# Patient Record
Sex: Female | Born: 1970 | Race: White | Hispanic: No | Marital: Married | Smoking: Former smoker
Health system: Southern US, Community
[De-identification: ages and names within clinical notes are randomized; demographics above are authoritative.]

## PROBLEM LIST (undated history)

## (undated) DIAGNOSIS — K219 Gastro-esophageal reflux disease without esophagitis: Secondary | ICD-10-CM

---

## 2000-11-06 ENCOUNTER — Other Ambulatory Visit: Admission: RE | Admit: 2000-11-06 | Discharge: 2000-11-06 | Payer: Self-pay | Admitting: Obstetrics and Gynecology

## 2001-03-30 ENCOUNTER — Inpatient Hospital Stay (HOSPITAL_COMMUNITY): Admission: AD | Admit: 2001-03-30 | Discharge: 2001-03-30 | Payer: Self-pay | Admitting: Obstetrics and Gynecology

## 2001-03-31 ENCOUNTER — Inpatient Hospital Stay (HOSPITAL_COMMUNITY): Admission: AD | Admit: 2001-03-31 | Discharge: 2001-03-31 | Payer: Self-pay | Admitting: Obstetrics and Gynecology

## 2001-05-18 ENCOUNTER — Inpatient Hospital Stay (HOSPITAL_COMMUNITY): Admission: AD | Admit: 2001-05-18 | Discharge: 2001-05-18 | Payer: Self-pay | Admitting: *Deleted

## 2001-06-07 ENCOUNTER — Inpatient Hospital Stay (HOSPITAL_COMMUNITY): Admission: AD | Admit: 2001-06-07 | Discharge: 2001-06-09 | Payer: Self-pay | Admitting: Obstetrics and Gynecology

## 2001-07-14 ENCOUNTER — Other Ambulatory Visit: Admission: RE | Admit: 2001-07-14 | Discharge: 2001-07-14 | Payer: Self-pay | Admitting: Obstetrics and Gynecology

## 2002-08-31 ENCOUNTER — Other Ambulatory Visit: Admission: RE | Admit: 2002-08-31 | Discharge: 2002-08-31 | Payer: Self-pay | Admitting: Obstetrics and Gynecology

## 2002-11-30 ENCOUNTER — Encounter: Admission: RE | Admit: 2002-11-30 | Discharge: 2002-11-30 | Payer: Self-pay

## 2003-05-09 ENCOUNTER — Other Ambulatory Visit: Admission: RE | Admit: 2003-05-09 | Discharge: 2003-05-09 | Payer: Self-pay | Admitting: Obstetrics and Gynecology

## 2003-05-29 ENCOUNTER — Encounter: Admission: RE | Admit: 2003-05-29 | Discharge: 2003-08-27 | Payer: Self-pay | Admitting: Obstetrics and Gynecology

## 2003-12-03 ENCOUNTER — Inpatient Hospital Stay (HOSPITAL_COMMUNITY): Admission: AD | Admit: 2003-12-03 | Discharge: 2003-12-05 | Payer: Self-pay | Admitting: Obstetrics and Gynecology

## 2003-12-19 ENCOUNTER — Emergency Department (HOSPITAL_COMMUNITY): Admission: EM | Admit: 2003-12-19 | Discharge: 2003-12-19 | Payer: Self-pay | Admitting: Emergency Medicine

## 2003-12-26 ENCOUNTER — Other Ambulatory Visit: Admission: RE | Admit: 2003-12-26 | Discharge: 2003-12-26 | Payer: Self-pay | Admitting: Obstetrics and Gynecology

## 2004-05-23 ENCOUNTER — Encounter: Admission: RE | Admit: 2004-05-23 | Discharge: 2004-05-23 | Payer: Self-pay | Admitting: Obstetrics and Gynecology

## 2010-02-16 ENCOUNTER — Emergency Department (HOSPITAL_COMMUNITY): Admission: EM | Admit: 2010-02-16 | Discharge: 2010-02-16 | Payer: Self-pay | Admitting: Family Medicine

## 2010-12-24 ENCOUNTER — Other Ambulatory Visit: Payer: Self-pay | Admitting: Internal Medicine

## 2010-12-24 DIAGNOSIS — G44009 Cluster headache syndrome, unspecified, not intractable: Secondary | ICD-10-CM

## 2010-12-24 DIAGNOSIS — R29810 Facial weakness: Secondary | ICD-10-CM

## 2010-12-27 ENCOUNTER — Other Ambulatory Visit: Payer: Self-pay

## 2010-12-31 ENCOUNTER — Other Ambulatory Visit: Payer: Self-pay

## 2010-12-31 LAB — POCT RAPID STREP A (OFFICE): Streptococcus, Group A Screen (Direct): POSITIVE — AB

## 2011-02-28 NOTE — H&P (Signed)
[facility]  Patient:    Sandy Gilbert, Sandy Gilbert Visit Number: [id number] MRN: [id number]          Service Type: Attending:  Sheronette A. Cherly Hensen, M.D. Dictated by:   Sheria Lang. Cherly Hensen, M.D. Adm. Date:  06/06/01                           History and Physical  DATE OF BIRTH:                1971-03-17  CHIEF COMPLAINT:              Post dates induction of labor.  HISTORY OF PRESENT ILLNESS:   This is a 40 year old gravida 1 para 0 female; LMP of August 23, 2000; [facility] of May 31, 2001; now at 40 and six-sevenths weeks gestation, being admitted for induction of labor.  The patient has had some mild occasional contractions, good fetal movement, intact membranes.  Gbs culture is negative.  Her last ultrasound on June 02, 2001 showed an estimated fetal weight of 8 pounds 9 ounces, normal amniotic fluid index, vertex presentation.  Prenatal care is at [facility], primary obstetrician Sheronette A. Cherly Hensen, M.D.  PRENATAL LABORATORY:          Blood type A positive, antibody screen negative. Rubella is equivocal.  RPR is nonreactive.  HIV test was nonreactive. Hepatitis B surface antigen is negative.  GC and chlamydia cultures are negative.  Pap was normal.  AFP3 test was normal.  Anatomic fetal survey was completed and normal on February 03, 2001.  One-hour GCT was normal.  Group B strep culture is negative.  PAST HISTORY:  ALLERGIES:                    No known drug allergies.  MEDICATIONS:                  Prenatal vitamins.  MEDICAL HISTORY:              Negative.  SURGICAL HISTORY:             Adenoids at age 28.  LEEP procedure in August 1994.  FAMILY HISTORY:               Sister alive with cervical cancer, had a hysterectomy.  Cancer of the colon - paternal aunt x 2.  Father has diabetes, adult onset and MIs in some paternal uncles.  SOCIAL HISTORY:               Married, nonsmoker.  Works in the Sports administrator.  REVIEW  OF SYSTEMS:            Negative except as noted in the history of present illness.  PHYSICAL EXAMINATION:  GENERAL:                      Gravid white female in no acute distress.  VITAL SIGNS:                  Blood pressure 100/70.  SKIN:                         Shows no lesions.  HEENT:                        Anicteric sclerae, pink conjunctivae. Oropharynx negative.  HEART:  Regular rate and rhythm without murmur.  LUNGS:                        Clear to auscultation.  BREASTS:                      Soft, nontender.  No palpable mass.  ABDOMEN:                      Gravid, term.  PELVIC:                       She was 2, 70%, -1, vertex presentation in the office on June 02, 2001.  IMPRESSION:                   Post dates, group B strep culture negative.  PLAN:                         Admission.  Pitocin induction if confirm favorable cervix, with amniotomy when feasible.  Routine obstetrical labs. Dictated by:   Sheria Lang. Cherly Hensen, M.D. Attending:  Sheronette A. Cherly Hensen, M.D. DD:  06/06/01 TD:  06/06/01 Job: [id number]

## 2011-02-28 NOTE — H&P (Signed)
NAME:  Sandy Gilbert, EBLEN                        ACCOUNT NO.:  [id number]   MEDICAL RECORD NO.:  [id number]                   PATIENT TYPE:  INP   LOCATION:  9164                                 FACILITY:  [facility]   PHYSICIAN:  Maxie Better, M.D.            DATE OF BIRTH:  1971-04-10   DATE OF ADMISSION:  12/03/2003  DATE OF DISCHARGE:                                HISTORY & PHYSICAL   CHIEF COMPLAINT:  Induction of labor secondary to favorable cervix.   HISTORY OF PRESENT ILLNESS:  A 40 year old, G2, P1-0-0-1, married, white  female with EDC of December 04, 2003, consistent with ultrasound done at 20-  4/7 weeks on July 19, 2003, who is now at term admitted for induction of  labor secondary to favorable cervix.  Exam on November 28, 2003, showed a  loose, 3 cm dilatation, 50% effaced, -2 to -1 vertex presentation.  She has  had increased pelvic pressure, irregular low back pain with good fetal  movements.  Group B Streptococcus culture was negative.  Her prenatal course  has been unremarkable.  LEEP in 1994, with term delivery in past.  The  patient desires to proceed with induction of labor.   PRENATAL COURSE:  Prenatal care is at [facility].  Primary obstetrician  is Maxie Better, M.D.   PRENATAL LABORATORY DATA:  Blood type is A positive, antibody screen  negative, RPR nonreactive.  Rubella immune.  Hepatitis B surface antigen is  negative.  HIV test is negative.  GC and Chlamydia cultures negative.  Pap  smear within normal limits.  AFP normal.  One-hour glucose challenge test  normal.  Group B Streptococcus culture negative.  Normal anatomic fetal  survey at 20.5 weeks on July 19, 2003.   ALLERGIES:  No known drug allergies.   MEDICATIONS:  Prenatal vitamins.   PAST MEDICAL HISTORY:  Gestational anemia.   PAST OBSTETRICAL HISTORY:  An 8 pound 4 ounce vacuum extraction at 41 weeks  with cord around the neck noted at the time.   PAST SURGICAL HISTORY:  1. LEEP in August 1994.  2. Adenoids at age 67.   FAMILY HISTORY:  Diabetes in father and paternal uncles.  Father with  history of blood clots.  Sister with cervical cancer diagnosed in her early  88s.  Colon cancer in two paternal aunts.  No breast cancer or ovarian  cancer.   SOCIAL HISTORY:  Married with one child.  Nonsmoker.  Futures trader.   REVIEW OF SYMPTOMS:  Negative.   PHYSICAL EXAMINATION:  GENERAL:  Well-developed, well-nourished, gravid,  white female in no acute distress.  VITAL SIGNS:  Blood pressure 113/63, afebrile.  SKIN:  No lesions.  HEENT:  Anicteric sclerae, pink conjunctivae.  Oropharynx negative.  HEART:  Regular rate and rhythm without murmur.  BREASTS:  Soft, nontender, no palpable mass.  ABDOMEN:  Gravid at term.  PELVIC:  Per HPI.  Baseline fetal heart rate  of 130s, reactive, rare  contractions.  EXTREMITIES:  Trace edema.   IMPRESSION:  Favorable cervix, term gestation with Group B Streptococcus  culture negative.   PLAN:  1. Admission.  2. High-dose Pitocin.  3. Epidural p.r.n.  4. Routine admission orders and labs.                                               Maxie Better, M.D.    [facility]  D:  12/03/2003  T:  12/03/2003  Job:  [id number]

## 2011-11-07 ENCOUNTER — Ambulatory Visit (INDEPENDENT_AMBULATORY_CARE_PROVIDER_SITE_OTHER): Payer: BC Managed Care – PPO

## 2011-11-07 DIAGNOSIS — J019 Acute sinusitis, unspecified: Secondary | ICD-10-CM

## 2012-03-04 ENCOUNTER — Ambulatory Visit (INDEPENDENT_AMBULATORY_CARE_PROVIDER_SITE_OTHER): Payer: BC Managed Care – PPO | Admitting: Physician Assistant

## 2012-03-04 VITALS — BP 102/68 | HR 75 | Temp 99.0°F | Resp 18 | Ht 66.0 in | Wt 152.6 lb

## 2012-03-04 DIAGNOSIS — R059 Cough, unspecified: Secondary | ICD-10-CM

## 2012-03-04 DIAGNOSIS — R05 Cough: Secondary | ICD-10-CM

## 2012-03-04 DIAGNOSIS — R509 Fever, unspecified: Secondary | ICD-10-CM

## 2012-03-04 DIAGNOSIS — J029 Acute pharyngitis, unspecified: Secondary | ICD-10-CM

## 2012-03-04 LAB — POCT RAPID STREP A (OFFICE): Rapid Strep A Screen: NEGATIVE

## 2012-03-04 MED ORDER — AMOXICILLIN 875 MG PO TABS: 875.0000 mg | ORAL_TABLET | Freq: Two times a day (BID) | ORAL | Status: AC

## 2012-03-04 NOTE — Progress Notes (Signed)
  Subjective:    Patient ID: Sandy Gilbert, female    DOB: August 25, 1971, 41 y.o.   MRN: [id number]  HPI Patient presents for evaluation of a sore throat. States her 82 y.o daughter was diagnosed with strep throat yesterday and today she has low-grade fever, chills, and sore throat. Admits to cough, congestion, and rhinorrhea due to lingering URI, but states the sore throat is new. She has not taken any medications yet. Has history of strep infections.     Review of Systems  Constitutional: Positive for fever and chills.  HENT: Positive for congestion, sore throat and rhinorrhea.   Respiratory: Positive for cough.   Skin: Negative for rash.  Neurological: Negative for headaches.       Objective:   Physical Exam  Constitutional: She is oriented to person, place, and time. She appears well-developed and well-nourished.  HENT:  Head: Normocephalic and atraumatic.  Right Ear: External ear normal.  Left Ear: External ear normal.  Mouth/Throat: Uvula is midline and mucous membranes are normal. No oropharyngeal exudate or tonsillar abscesses.       Bilateral tonsillar erythema. No tonsillar swelling  Eyes: Conjunctivae are normal.  Neck: Neck supple.  Cardiovascular: Normal rate, regular rhythm and normal heart sounds.   Pulmonary/Chest: Effort normal and breath sounds normal.  Lymphadenopathy:    She has no cervical adenopathy.  Neurological: She is alert and oriented to person, place, and time.  Psychiatric: She has a normal mood and affect. Her behavior is normal. Judgment and thought content normal.   Results for orders placed in visit on 03/04/12  POCT RAPID STREP A (OFFICE)      Component Value Range   Rapid Strep A Screen Negative  Negative           Assessment & Plan:   1. Acute pharyngitis  Will treat prophylactically for strep due to recent +contact.  Strep culture done POCT rapid strep A, Culture, Group A Strep, amoxicillin (AMOXIL) 875 MG tablet bid x 10 days     2. Cough    3. Fever

## 2012-03-04 NOTE — Patient Instructions (Signed)

## 2012-03-07 LAB — CULTURE, GROUP A STREP: Organism ID, Bacteria: NORMAL

## 2012-03-08 NOTE — Progress Notes (Signed)
Precepted with Ms. Marte, PA-C and agree.  

## 2012-03-09 ENCOUNTER — Telehealth: Payer: Self-pay

## 2012-03-09 NOTE — Telephone Encounter (Signed)
PT STATES SHE WAS SEEN AND GIVEN A STREP TEST, SHE DIDN'T HAVE STREP BUT ISN'T ANY BETTER PLEASE CALL [phone]

## 2012-03-09 NOTE — Telephone Encounter (Signed)
Spoke w/pt about her lab results and pt agreed to RTC for recheck as directed by [facility] on lab result notes.

## 2012-11-11 ENCOUNTER — Other Ambulatory Visit: Payer: Self-pay | Admitting: Obstetrics and Gynecology

## 2012-11-11 DIAGNOSIS — Z1231 Encounter for screening mammogram for malignant neoplasm of breast: Secondary | ICD-10-CM

## 2012-11-23 ENCOUNTER — Ambulatory Visit
Admission: RE | Admit: 2012-11-23 | Discharge: 2012-11-23 | Disposition: A | Discharge status: Home / Self Care | Payer: BC Managed Care – PPO | Source: Ambulatory Visit | Attending: Obstetrics and Gynecology | Admitting: Obstetrics and Gynecology

## 2012-11-23 DIAGNOSIS — Z1231 Encounter for screening mammogram for malignant neoplasm of breast: Secondary | ICD-10-CM

## 2013-11-30 ENCOUNTER — Ambulatory Visit (INDEPENDENT_AMBULATORY_CARE_PROVIDER_SITE_OTHER): Payer: BC Managed Care – PPO | Admitting: Family Medicine

## 2013-11-30 VITALS — BP 116/62 | HR 86 | Temp 98.9°F | Resp 16 | Ht 66.5 in | Wt 163.0 lb

## 2013-11-30 DIAGNOSIS — K219 Gastro-esophageal reflux disease without esophagitis: Secondary | ICD-10-CM

## 2013-11-30 DIAGNOSIS — R1110 Vomiting, unspecified: Secondary | ICD-10-CM

## 2013-11-30 DIAGNOSIS — R12 Heartburn: Secondary | ICD-10-CM

## 2013-11-30 LAB — POCT CBC
Granulocyte percent: 72.3 %G (ref 37–80)
HCT, POC: 42 % (ref 37.7–47.9)
HEMOGLOBIN: 13 g/dL (ref 12.2–16.2)
Lymph, poc: 2 (ref 0.6–3.4)
MCH, POC: 28.3 pg (ref 27–31.2)
MCHC: 31 g/dL — AB (ref 31.8–35.4)
MCV: 91.2 fL (ref 80–97)
MID (cbc): 0.4 (ref 0–0.9)
MPV: 9.4 fL (ref 0–99.8)
POC GRANULOCYTE: 6.4 (ref 2–6.9)
POC LYMPH PERCENT: 22.8 %L (ref 10–50)
POC MID %: 4.9 % (ref 0–12)
Platelet Count, POC: 290 10*3/uL (ref 142–424)
RBC: 4.6 M/uL (ref 4.04–5.48)
RDW, POC: 14 %
WBC: 8.9 10*3/uL (ref 4.6–10.2)

## 2013-11-30 MED ORDER — ESOMEPRAZOLE MAGNESIUM 40 MG PO CPDR: 20.0000 mg | DELAYED_RELEASE_CAPSULE | Freq: Every day | ORAL | Status: DC

## 2013-11-30 NOTE — Patient Instructions (Signed)
Drink plenty of fluids and eat a bland diet.  Take miralax daily until stools are on the loose side  Continue zantac (ranitidine) 150 mg twice daily for 2 days)  Take Nexium one daily for 10 days  If worse or not improving return and we will pursue other evaluation as needed.

## 2013-11-30 NOTE — Progress Notes (Signed)
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  In the night she got heartburn.  She vomited and dry heaved into Monday AM, none since.  She has "constipated-like" bowel movements.  No diarrhea.  Continues with episodic heartburn, despite antacids and zantac.  Objective": No acute distress.  Chest clear.  Heart normal.  BS active.  Abdomen soft with some high epigastric tenderness.  Assessment: GERD Constipation  Plan: CBC  Results for orders placed in visit on 11/30/13  POCT CBC      Result Value Ref Range   WBC 8.9  4.6 - 10.2 K/uL   Lymph, poc 2.0  0.6 - 3.4   POC LYMPH PERCENT 22.8  10 - 50 %L   MID (cbc) 0.4  0 - 0.9   POC MID % 4.9  0 - 12 %M   POC Granulocyte 6.4  2 - 6.9   Granulocyte percent 72.3  37 - 80 %G   RBC 4.60  4.04 - 5.48 M/uL   Hemoglobin 13.0  12.2 - 16.2 g/dL   HCT, POC 16.142.0  09.637.7 - 47.9 %   MCV 91.2  80 - 97 fL   MCH, POC 28.3  27 - 31.2 pg   MCHC 31.0 (*) 31.8 - 35.4 g/dL   RDW, POC 04.514.0     Platelet Count, POC 290  142 - 424 K/uL   MPV 9.4  0 - 99.8 fL

## 2013-12-19 ENCOUNTER — Ambulatory Visit (INDEPENDENT_AMBULATORY_CARE_PROVIDER_SITE_OTHER): Payer: BC Managed Care – PPO | Admitting: Emergency Medicine

## 2013-12-19 VITALS — BP 110/66 | HR 89 | Temp 98.7°F | Resp 18 | Ht 65.5 in | Wt 158.0 lb

## 2013-12-19 DIAGNOSIS — J029 Acute pharyngitis, unspecified: Secondary | ICD-10-CM

## 2013-12-19 MED ORDER — SUCRALFATE 1 G PO TABS: ORAL_TABLET | ORAL | Status: DC

## 2013-12-19 NOTE — Patient Instructions (Signed)
Pharyngitis °Pharyngitis is redness, pain, and swelling (inflammation) of your pharynx.  °CAUSES  °Pharyngitis is usually caused by infection. Most of the time, these infections are from viruses (viral) and are part of a cold. However, sometimes pharyngitis is caused by bacteria (bacterial). Pharyngitis can also be caused by allergies. Viral pharyngitis may be spread from person to person by coughing, sneezing, and personal items or utensils (cups, forks, spoons, toothbrushes). Bacterial pharyngitis may be spread from person to person by more intimate contact, such as kissing.  °SIGNS AND SYMPTOMS  °Symptoms of pharyngitis include:   °· Sore throat.   °· Tiredness (fatigue).   °· Low-grade fever.   °· Headache. °· Joint pain and muscle aches. °· Skin rashes. °· Swollen lymph nodes. °· Plaque-like film on throat or tonsils (often seen with bacterial pharyngitis). °DIAGNOSIS  °Your health care provider will ask you questions about your illness and your symptoms. Your medical history, along with a physical exam, is often all that is needed to diagnose pharyngitis. Sometimes, a rapid strep test is done. Other lab tests may also be done, depending on the suspected cause.  °TREATMENT  °Viral pharyngitis will usually get better in 3 4 days without the use of medicine. Bacterial pharyngitis is treated with medicines that kill germs (antibiotics).  °HOME CARE INSTRUCTIONS  °· Drink enough water and fluids to keep your urine clear or pale yellow.   °· Only take over-the-counter or prescription medicines as directed by your health care provider:   °· If you are prescribed antibiotics, make sure you finish them even if you start to feel better.   °· Do not take aspirin.   °· Get lots of rest.   °· Gargle with 8 oz of salt water (½ tsp of salt per 1 qt of water) as often as every 1 2 hours to soothe your throat.   °· Throat lozenges (if you are not at risk for choking) or sprays may be used to soothe your throat. °SEEK MEDICAL  CARE IF:  °· You have large, tender lumps in your neck. °· You have a rash. °· You cough up green, yellow-brown, or bloody spit. °SEEK IMMEDIATE MEDICAL CARE IF:  °· Your neck becomes stiff. °· You drool or are unable to swallow liquids. °· You vomit or are unable to keep medicines or liquids down. °· You have severe pain that does not go away with the use of recommended medicines. °· You have trouble breathing (not caused by a stuffy nose). °MAKE SURE YOU:  °· Understand these instructions. °· Will watch your condition. °· Will get help right away if you are not doing well or get worse. °Document Released: 09/29/2005 Document Revised: 07/20/2013 Document Reviewed: 06/06/2013 °ExitCare® Patient Information ©2014 [employer]. ° °

## 2013-12-19 NOTE — Progress Notes (Signed)
[facility] [address], [place] [address] [phone]  Date:  12/19/2013   Name:  Sandy Gilbert   DOB:  [birth year]   MRN:  [id number]  PCP:  No PCP Per Patient    Chief Complaint: URI   History of Present Illness:  Sandy Gilbert is a 43 y.o. very pleasant female patient who presents with the following:  Ill with sore throat and cough.  Has increased size and painful lymph nodes over past 24 hours.  Previously had nasal congestion clear in color with a cough productive of mucoid sputum.  No wheezing or shortness of breath. Felt feverish but no chills. No rash or nausea or vomiting.  No improvement with over the counter medications or other home remedies. No improvement with over the counter medications or other home remedies. Denies other complaint or health concern today. Multiple kids at home sick.    There are no active problems to display for this patient.   No past medical history on file.  No past surgical history on file.  History  Substance Use Topics  . Smoking status: Former Smoker    Quit date: 03/04/2000  . Smokeless tobacco: Never Used  . Alcohol Use: Yes     Comment: very rare    No family history on file.  No Known Allergies  Medication list has been reviewed and updated.  Current Outpatient Prescriptions on File Prior to Visit  Medication Sig Dispense Refill  . esomeprazole (NEXIUM) 40 MG capsule Take 1 capsule (40 mg total) by mouth daily.  30 capsule  1  . etonogestrel-ethinyl estradiol (NUVARING) 0.12-0.015 MG/24HR vaginal ring Place 1 each vaginally every 28 (twenty-eight) days. Insert vaginally and leave in place for 3 consecutive weeks, then remove for 1 week.       No current facility-administered medications on file prior to visit.    Review of Systems:  As per HPI, otherwise negative.    Physical Examination: Filed Vitals:   12/19/13 1914  BP: 110/66  Pulse: 89  Temp: 98.7 F (37.1 C)  Resp: 18    Filed Vitals:   12/19/13 1914  Height: 5' 5.5" (1.664 m)  Weight: 158 lb (71.668 kg)   Body mass index is 25.88 kg/(m^2). Ideal Body Weight: Weight in (lb) to have BMI = 25: 152.2  GEN: WDWN, NAD, Non-toxic, A & O x 3 HEENT: Atraumatic, Normocephalic. Neck supple. No masses, No LAD. Ears and Nose: No external deformity. CV: RRR, No M/G/R. No JVD. No thrill. No extra heart sounds. PULM: CTA B, no wheezes, crackles, rhonchi. No retractions. No resp. distress. No accessory muscle use. ABD: S, NT, ND, +BS. No rebound. No HSM. EXTR: No c/c/e NEURO Normal gait.  PSYCH: Normally interactive. Conversant. Not depressed or anxious appearing.  Calm demeanor.    Assessment and Plan: Pharyngitis likely viral Children with viral pharyngitis   Signed,  Phillips OdorJeffery Jaquan Sadowsky, MD

## 2013-12-24 ENCOUNTER — Ambulatory Visit: Payer: BC Managed Care – PPO

## 2013-12-24 ENCOUNTER — Ambulatory Visit (INDEPENDENT_AMBULATORY_CARE_PROVIDER_SITE_OTHER): Payer: BC Managed Care – PPO | Admitting: Family Medicine

## 2013-12-24 VITALS — BP 104/60 | HR 119 | Temp 100.4°F | Resp 18 | Wt 156.0 lb

## 2013-12-24 DIAGNOSIS — R05 Cough: Secondary | ICD-10-CM

## 2013-12-24 DIAGNOSIS — J029 Acute pharyngitis, unspecified: Secondary | ICD-10-CM

## 2013-12-24 DIAGNOSIS — J189 Pneumonia, unspecified organism: Secondary | ICD-10-CM

## 2013-12-24 DIAGNOSIS — J069 Acute upper respiratory infection, unspecified: Secondary | ICD-10-CM

## 2013-12-24 DIAGNOSIS — R059 Cough, unspecified: Secondary | ICD-10-CM

## 2013-12-24 DIAGNOSIS — R509 Fever, unspecified: Secondary | ICD-10-CM

## 2013-12-24 LAB — POCT CBC
Granulocyte percent: 78.2 %G (ref 37–80)
HCT, POC: 37.1 % — AB (ref 37.7–47.9)
Hemoglobin: 11.7 g/dL — AB (ref 12.2–16.2)
Lymph, poc: 2.5 (ref 0.6–3.4)
MCH: 27.7 pg (ref 27–31.2)
MCHC: 31.5 g/dL — AB (ref 31.8–35.4)
MCV: 87.6 fL (ref 80–97)
MID (CBC): 0.8 (ref 0–0.9)
MPV: 9.3 fL (ref 0–99.8)
PLATELET COUNT, POC: 359 10*3/uL (ref 142–424)
POC Granulocyte: 11.8 — AB (ref 2–6.9)
POC LYMPH %: 16.6 % (ref 10–50)
POC MID %: 5.2 %M (ref 0–12)
RBC: 4.23 M/uL (ref 4.04–5.48)
RDW, POC: 13.4 %
WBC: 15.1 10*3/uL — AB (ref 4.6–10.2)

## 2013-12-24 LAB — POCT RAPID STREP A (OFFICE): RAPID STREP A SCREEN: NEGATIVE

## 2013-12-24 LAB — POCT INFLUENZA A/B
Influenza A, POC: NEGATIVE
Influenza B, POC: NEGATIVE

## 2013-12-24 MED ORDER — AMOXICILLIN-POT CLAVULANATE 875-125 MG PO TABS: 1.0000 | ORAL_TABLET | Freq: Two times a day (BID) | ORAL | Status: DC

## 2013-12-24 MED ORDER — IPRATROPIUM BROMIDE 0.03 % NA SOLN: 2.0000 | Freq: Two times a day (BID) | NASAL | Status: DC

## 2013-12-24 NOTE — Progress Notes (Addendum)
Subjective:    Patient ID: Sandy Gilbert, female    DOB: [birth year], 43 y.o.   MRN: [id number]  Fever  Associated symptoms include abdominal pain, chest pain, congestion, coughing, diarrhea, headaches, nausea and vomiting. Pertinent negatives include no ear pain, rash, sore throat or wheezing.  Cough Associated symptoms include chest pain, chills, a fever, headaches, postnasal drip, rhinorrhea and shortness of breath. Pertinent negatives include no ear pain, rash, sore throat or wheezing.   Chief Complaint  Patient presents with  . Fever    102.8 today -was seen on 03/08 has gotten worse   . Generalized Body Aches  . Cough  . Nasal Congestion    This chart was scribed for Ethelda Chick, MD by Andrew Au, ED Scribe. This patient was seen in room 14 and the patient's care was started at 8:57 AM.  HPI Comments: Sandy Gilbert is a 43 y.o. female who [facility] complaining of worsening cold symptoms onset 2 weeks. Pt states that she was seen 5 days ago by Dr. Dareen Piano and that her symptoms have worsened.  Pt reports that her overall symptoms started 2 weeks ago with cough and congestion. She reports that she was taking OTC medication and was feeling better. She reports that her symptoms came back a week later. She reports that her daughter was seen for a virus 6 days ago and that she came in the day after. Pt has had 4 episodes of diarrhea 4 times this week. She reports CP and fever of 102.1 onset today, light headiness, fatigue, HA, cough consisting of clear sputum, and back pain. She denies ear pain and reports that her sore throat has gotten better.  Pt was seen heartburn in which she was prescribed nexium. She reports that it has helped but not too much. She reports mild SOB and reports CP with deep breaths. Questioning how long she should take Nexium before undergoing evaluation by a specialist. +nausea and intermittent vomiting.  Denies melenotic  stools.  History reviewed. No pertinent past medical history. Allergies  Allergen Reactions  . Codeine    Prior to Admission medications   Medication Sig Start Date End Date Taking? Authorizing Provider  acetaminophen (TYLENOL) 325 MG tablet Take 650 mg by mouth every 6 (six) hours as needed.   Yes Historical Provider, MD  esomeprazole (NEXIUM) 40 MG capsule Take 1 capsule (40 mg total) by mouth daily. 11/30/13  Yes Peyton Najjar, MD  etonogestrel-ethinyl estradiol (NUVARING) 0.12-0.015 MG/24HR vaginal ring Place 1 each vaginally every 28 (twenty-eight) days. Insert vaginally and leave in place for 3 consecutive weeks, then remove for 1 week.   Yes Historical Provider, MD  guaiFENesin-dextromethorphan (ROBITUSSIN DM) 100-10 MG/5ML syrup Take 5 mLs by mouth every 4 (four) hours as needed for cough.   Yes Historical Provider, MD  sucralfate (CARAFATE) 1 G tablet Take one 1 hr ac and hs 12/19/13  Yes Phillips Odor, MD   History   Social History  . Marital Status: Married    Spouse Name: N/A    Number of Children: N/A  . Years of Education: N/A   Occupational History  . Not on file.   Social History Main Topics  . Smoking status: Former Smoker    Quit date: 03/04/2000  . Smokeless tobacco: Never Used  . Alcohol Use: Yes     Comment: very rare  . Drug Use: No  . Sexual Activity: Not on file  Other Topics Concern  . Not on file   Social History Narrative  . No narrative on file    Review of Systems  Constitutional: Positive for fever, chills, diaphoresis and fatigue.  HENT: Positive for congestion, postnasal drip, rhinorrhea and voice change. Negative for ear pain, sinus pressure and sore throat.   Respiratory: Positive for cough, chest tightness and shortness of breath. Negative for wheezing and stridor.   Cardiovascular: Positive for chest pain.  Gastrointestinal: Positive for nausea, vomiting, abdominal pain and diarrhea.  Skin: Negative for rash.  Neurological:  Positive for light-headedness and headaches.       Objective:   Physical Exam  Nursing note and vitals reviewed. Constitutional: She is oriented to person, place, and time. She appears well-developed and well-nourished. No distress.  HENT:  Head: Normocephalic and atraumatic.  Right Ear: External ear normal.  Left Ear: External ear normal.  Nose: Right sinus exhibits no maxillary sinus tenderness and no frontal sinus tenderness. Left sinus exhibits no maxillary sinus tenderness and no frontal sinus tenderness.  Mouth/Throat: Posterior oropharyngeal erythema ( very mild) present. No oropharyngeal exudate.  Eyes: Conjunctivae and EOM are normal. Pupils are equal, round, and reactive to light.  Neck: Neck supple. No tracheal deviation present. No thyromegaly present.  Cardiovascular: Normal rate, regular rhythm and normal heart sounds.   No murmur heard. Pulmonary/Chest: Effort normal and breath sounds normal. No respiratory distress. She has no wheezes. She has no rales.  Abdominal: Soft. Bowel sounds are normal. She exhibits no distension. There is no tenderness. There is no rebound and no guarding.  Musculoskeletal: Normal range of motion.  Lymphadenopathy:    She has no cervical adenopathy.  Neurological: She is alert and oriented to person, place, and time.  Skin: Skin is warm and dry. She is not diaphoretic.  Psychiatric: She has a normal mood and affect. Her behavior is normal.   Results for orders placed in visit on 12/24/13  POCT CBC      Result Value Ref Range   WBC 15.1 (*) 4.6 - 10.2 K/uL   Lymph, poc 2.5  0.6 - 3.4   POC LYMPH PERCENT 16.6  10 - 50 %L   MID (cbc) 0.8  0 - 0.9   POC MID % 5.2  0 - 12 %M   POC Granulocyte 11.8 (*) 2 - 6.9   Granulocyte percent 78.2  37 - 80 %G   RBC 4.23  4.04 - 5.48 M/uL   Hemoglobin 11.7 (*) 12.2 - 16.2 g/dL   HCT, POC 16.137.1 (*) 09.637.7 - 47.9 %   MCV 87.6  80 - 97 fL   MCH, POC 27.7  27 - 31.2 pg   MCHC 31.5 (*) 31.8 - 35.4 g/dL    RDW, POC 04.513.4     Platelet Count, POC 359  142 - 424 K/uL   MPV 9.3  0 - 99.8 fL  POCT INFLUENZA A/B      Result Value Ref Range   Influenza A, POC Negative     Influenza B, POC Negative    POCT RAPID STREP A (OFFICE)      Result Value Ref Range   Rapid Strep A Screen Negative  Negative   [facility] reading (PRIMARY) by  Dr. Katrinka BlazingSmith.  CXR:  NAD      Assessment & Plan:  Acute pharyngitis - Plan: POCT CBC, POCT rapid strep A, Culture, Group A Strep  Cough - Plan: POCT CBC, POCT Influenza A/B, DG Chest 2 View  Fever - Plan: POCT CBC, POCT Influenza A/B, POCT rapid strep A  Acute upper respiratory infections of unspecified site  CAP (community acquired pneumonia)  1. Community Acquired Pneumonia:  New. Rx for Augmentin provided; also added Zpack for atypical coverage. Recommend repeat CXR in 2-3 months.   2. URI:  New. With secondary bacterial infection; rx for Augmentin and Atrovent nasal spray provided.  Continue OTC cough suppressant.  RTC for acute worsening. 3. GERD: improving slowly; advised to continue Nexium 40mg  daily for one month and then attempt to wean.  Can continue daily Nexium for up to three months if needed.  Dietary modification reviewed in detail.  I personally performed the services described in this documentation, which was scribed in my presence.  The recorded information has been reviewed and is accurate.  Nilda Simmer, M.D.  [facility] [address] [place], [place]  [address] [phone] phone [phone] fax

## 2013-12-26 MED ORDER — AZITHROMYCIN 250 MG PO TABS: ORAL_TABLET | ORAL | Status: DC

## 2013-12-26 NOTE — Addendum Note (Signed)
Addended by: Ethelda ChickSMITH, Stephani Janak M on: 12/26/2013 08:58 AM   Modules accepted: Orders

## 2013-12-27 LAB — CULTURE, GROUP A STREP: ORGANISM ID, BACTERIA: NORMAL

## 2013-12-28 ENCOUNTER — Telehealth: Payer: Self-pay | Admitting: Family Medicine

## 2013-12-28 NOTE — Telephone Encounter (Signed)
Left vm for patient to give us a call back to schedule ov in 2-3 months for follow-up pneumonia with repeat CXR. W/doctor Katrinka BlazingSmith

## 2014-01-06 ENCOUNTER — Other Ambulatory Visit: Payer: Self-pay | Admitting: Obstetrics and Gynecology

## 2014-01-06 DIAGNOSIS — N6312 Unspecified lump in the right breast, upper inner quadrant: Secondary | ICD-10-CM

## 2014-01-19 ENCOUNTER — Ambulatory Visit
Admission: RE | Admit: 2014-01-19 | Discharge: 2014-01-19 | Disposition: A | Discharge status: Home / Self Care | Payer: BC Managed Care – PPO | Source: Ambulatory Visit | Attending: Obstetrics and Gynecology | Admitting: Obstetrics and Gynecology

## 2014-01-19 ENCOUNTER — Ambulatory Visit
Admission: RE | Admit: 2014-01-19 | Discharge: 2014-01-19 | Disposition: A | Discharge status: Home / Self Care | Payer: Self-pay | Source: Ambulatory Visit | Attending: Obstetrics and Gynecology | Admitting: Obstetrics and Gynecology

## 2014-01-19 DIAGNOSIS — N6312 Unspecified lump in the right breast, upper inner quadrant: Secondary | ICD-10-CM

## 2014-02-02 ENCOUNTER — Ambulatory Visit (INDEPENDENT_AMBULATORY_CARE_PROVIDER_SITE_OTHER): Payer: BC Managed Care – PPO | Admitting: Family Medicine

## 2014-02-02 VITALS — BP 110/74 | HR 84 | Temp 98.1°F | Resp 16 | Ht 65.0 in | Wt 157.0 lb

## 2014-02-02 DIAGNOSIS — L299 Pruritus, unspecified: Secondary | ICD-10-CM

## 2014-02-02 DIAGNOSIS — R21 Rash and other nonspecific skin eruption: Secondary | ICD-10-CM

## 2014-02-02 DIAGNOSIS — D649 Anemia, unspecified: Secondary | ICD-10-CM

## 2014-02-02 MED ORDER — PREDNISONE 20 MG PO TABS: ORAL_TABLET | ORAL | Status: DC

## 2014-02-02 NOTE — Progress Notes (Signed)
[facility] [address], [place] [address] [phone]  Date:  02/02/2014   Name:  Sandy Grahamllison G Whedbee   DOB:  [birth year]   MRN:  [id number]  PCP:  Nilda SimmerSMITH,KRISTI, MD    Chief Complaint: Rash   History of Present Illness:  Sandy Gilbert is a 43 y.o. very pleasant female patient who presents with the following:  Here today to discuss a rash.  She is generally healthy.  Uses nuva-ring for contraception.  LMP earlier this month.   She has noted a rash for about one week- it is very itchy.  It seems to be getting worse and spreading. She will scratch it until it bleeds sometimes.  She has used antihistamines and cortisone cream which helps some.   She has not been out in the yard working and does not think she has been exposed to any irritants. She lives with her husband and children and they are all fine.   No new pets, foods or meds except the abx she took last month; however her sx did not start until well after she finished this medication.    She is generally healthy except for recent pneumonia.    There are no active problems to display for this patient.   No past medical history on file.  No past surgical history on file.  History  Substance Use Topics  . Smoking status: Former Smoker    Quit date: 03/04/2000  . Smokeless tobacco: Never Used  . Alcohol Use: Yes     Comment: very rare    No family history on file.  Allergies  Allergen Reactions  . Codeine     Medication list has been reviewed and updated.  Current Outpatient Prescriptions on File Prior to Visit  Medication Sig Dispense Refill  . acetaminophen (TYLENOL) 325 MG tablet Take 650 mg by mouth every 6 (six) hours as needed.      Marland Kitchen. esomeprazole (NEXIUM) 40 MG capsule Take 1 capsule (40 mg total) by mouth daily.  30 capsule  1  . etonogestrel-ethinyl estradiol (NUVARING) 0.12-0.015 MG/24HR vaginal ring Place 1 each vaginally every 28 (twenty-eight) days. Insert vaginally and  leave in place for 3 consecutive weeks, then remove for 1 week.      . sucralfate (CARAFATE) 1 G tablet Take one 1 hr ac and hs  120 tablet  0  . amoxicillin-clavulanate (AUGMENTIN) 875-125 MG per tablet Take 1 tablet by mouth 2 (two) times daily.  20 tablet  0  . azithromycin (ZITHROMAX) 250 MG tablet Two tablets daily x 1 day then one tablet daily x 4 days  6 tablet  0  . guaiFENesin-dextromethorphan (ROBITUSSIN DM) 100-10 MG/5ML syrup Take 5 mLs by mouth every 4 (four) hours as needed for cough.      Marland Kitchen. ipratropium (ATROVENT) 0.03 % nasal spray Place 2 sprays into the nose 2 (two) times daily.  30 mL  0   No current facility-administered medications on file prior to visit.    Review of Systems:  As per HPI- otherwise negative. Otherwise she feels well- no fever, malaise or other sx that she has noted  Physical Examination: Filed Vitals:   02/02/14 1936  BP: 110/74  Pulse: 84  Temp: 98.1 F (36.7 C)  Resp: 16   Filed Vitals:   02/02/14 1936  Height: 5\' 5"  (1.651 m)  Weight: 157 lb (71.215 kg)   Body mass index is 26.13 kg/(m^2). Ideal Body Weight: Weight in (  lb) to have BMI = 25: 149.9  GEN: WDWN, NAD, Non-toxic, A & O x 3, looks well HEENT: Atraumatic, Normocephalic. Neck supple. No masses, No LAD.  No oral lesions.  Ears and Nose: No external deformity. CV: RRR, No M/G/R. No JVD. No thrill. No extra heart sounds. PULM: CTA B, no wheezes, crackles, rhonchi. No retractions. No resp. distress. No accessory muscle use. ABD: S, NT, ND, +BS. No rebound. No HSM. EXTR: No c/c/e NEURO Normal gait.  PSYCH: Normally interactive. Conversant. Not depressed or anxious appearing.  Calm demeanor.  She has an excoriated rash over her right arm, feet, waistline and her legs.  Nothing in between her fingers.  The rash is non- specific; it is small and palpable, no urticaria.    Assessment and Plan: Itching - Plan: Sedimentation Rate, CBC, Comprehensive metabolic panel, predniSONE  (DELTASONE) 20 MG tablet, DISCONTINUED: predniSONE (DELTASONE) 20 MG tablet  Rash and nonspecific skin eruption - Plan: predniSONE (DELTASONE) 20 MG tablet, DISCONTINUED: predniSONE (DELTASONE) 20 MG tablet  Check labs as above,  In the meantime treat with prednisone for itching and rash.  Will plan further follow- up pending labs. Scabies is something to think about but doubt as her family is ok and she has nothing between her fingers or on her hands  Meds ordered this encounter  Medications  . DISCONTD: predniSONE (DELTASONE) 20 MG tablet    Sig: Take 2 pills a day for 4 days, then 1 pill a day for 4 days    Dispense:  12 tablet    Refill:  0  . predniSONE (DELTASONE) 20 MG tablet    Sig: Take 2 pills a day for 4 days, then 1 pill a day for 4 days    Dispense:  12 tablet    Refill:  0     Signed Abbe AmsterdamJessica Javiana Anwar, MD

## 2014-02-02 NOTE — Patient Instructions (Signed)
Use the prednisone as directed.  I will be in touch regarding your labs asap.  You can continue to use antihistamines and OTC anti- itch/ cortisone products as needed.  Let me know if you are not better in the next couple of days- Sooner if worse.

## 2014-02-03 LAB — CBC
HCT: 33.4 % — ABNORMAL LOW (ref 36.0–46.0)
HEMOGLOBIN: 11.3 g/dL — AB (ref 12.0–15.0)
MCH: 27.8 pg (ref 26.0–34.0)
MCHC: 33.8 g/dL (ref 30.0–36.0)
MCV: 82.3 fL (ref 78.0–100.0)
PLATELETS: 287 10*3/uL (ref 150–400)
RBC: 4.06 MIL/uL (ref 3.87–5.11)
RDW: 14.3 % (ref 11.5–15.5)
WBC: 6.8 10*3/uL (ref 4.0–10.5)

## 2014-02-03 LAB — COMPREHENSIVE METABOLIC PANEL
ALBUMIN: 3.7 g/dL (ref 3.5–5.2)
ALT: 10 U/L (ref 0–35)
AST: 13 U/L (ref 0–37)
Alkaline Phosphatase: 44 U/L (ref 39–117)
BILIRUBIN TOTAL: 0.4 mg/dL (ref 0.2–1.2)
BUN: 14 mg/dL (ref 6–23)
CO2: 24 meq/L (ref 19–32)
Calcium: 8.6 mg/dL (ref 8.4–10.5)
Chloride: 106 mEq/L (ref 96–112)
Creat: 0.88 mg/dL (ref 0.50–1.10)
Glucose, Bld: 69 mg/dL — ABNORMAL LOW (ref 70–99)
Potassium: 3.9 mEq/L (ref 3.5–5.3)
SODIUM: 137 meq/L (ref 135–145)
TOTAL PROTEIN: 6.7 g/dL (ref 6.0–8.3)

## 2014-02-03 LAB — SEDIMENTATION RATE: Sed Rate: 17 mm/hr (ref 0–22)

## 2014-02-04 ENCOUNTER — Encounter: Payer: Self-pay | Admitting: Family Medicine

## 2014-02-04 NOTE — Addendum Note (Signed)
Addended by: Abbe AmsterdamOPLAND, Dylin Ihnen C on: 02/04/2014 07:01 AM   Modules accepted: Orders

## 2014-05-17 ENCOUNTER — Inpatient Hospital Stay (HOSPITAL_COMMUNITY): Payer: BC Managed Care – PPO

## 2014-05-17 ENCOUNTER — Encounter (HOSPITAL_COMMUNITY): Payer: Self-pay | Admitting: Emergency Medicine

## 2014-05-17 ENCOUNTER — Ambulatory Visit (INDEPENDENT_AMBULATORY_CARE_PROVIDER_SITE_OTHER): Payer: BC Managed Care – PPO | Admitting: Family Medicine

## 2014-05-17 ENCOUNTER — Ambulatory Visit (HOSPITAL_COMMUNITY)
Admission: RE | Admit: 2014-05-17 | Discharge: 2014-05-17 | Disposition: A | Discharge status: Home / Self Care | Payer: BC Managed Care – PPO | Source: Ambulatory Visit | Attending: Family Medicine | Admitting: Family Medicine

## 2014-05-17 ENCOUNTER — Inpatient Hospital Stay (HOSPITAL_COMMUNITY)
Admission: EM | Admit: 2014-05-17 | Discharge: 2014-05-24 | DRG: 418 | Disposition: A | Discharge status: Home / Self Care | Payer: BC Managed Care – PPO | Attending: Surgery | Admitting: Surgery

## 2014-05-17 VITALS — BP 122/74 | HR 85 | Temp 98.2°F | Resp 17 | Ht 65.5 in | Wt 159.0 lb

## 2014-05-17 DIAGNOSIS — K859 Acute pancreatitis without necrosis or infection, unspecified: Principal | ICD-10-CM | POA: Diagnosis present

## 2014-05-17 DIAGNOSIS — K5900 Constipation, unspecified: Secondary | ICD-10-CM | POA: Diagnosis present

## 2014-05-17 DIAGNOSIS — R932 Abnormal findings on diagnostic imaging of liver and biliary tract: Secondary | ICD-10-CM

## 2014-05-17 DIAGNOSIS — K8020 Calculus of gallbladder without cholecystitis without obstruction: Secondary | ICD-10-CM

## 2014-05-17 DIAGNOSIS — K8064 Calculus of gallbladder and bile duct with chronic cholecystitis without obstruction: Secondary | ICD-10-CM | POA: Diagnosis present

## 2014-05-17 DIAGNOSIS — K831 Obstruction of bile duct: Secondary | ICD-10-CM

## 2014-05-17 DIAGNOSIS — Z87891 Personal history of nicotine dependence: Secondary | ICD-10-CM | POA: Diagnosis not present

## 2014-05-17 DIAGNOSIS — K219 Gastro-esophageal reflux disease without esophagitis: Secondary | ICD-10-CM | POA: Diagnosis present

## 2014-05-17 DIAGNOSIS — D649 Anemia, unspecified: Secondary | ICD-10-CM | POA: Diagnosis present

## 2014-05-17 DIAGNOSIS — R1013 Epigastric pain: Secondary | ICD-10-CM

## 2014-05-17 DIAGNOSIS — K8060 Calculus of gallbladder and bile duct with cholecystitis, unspecified, without obstruction: Secondary | ICD-10-CM | POA: Diagnosis present

## 2014-05-17 DIAGNOSIS — K861 Other chronic pancreatitis: Secondary | ICD-10-CM

## 2014-05-17 HISTORY — DX: Gastro-esophageal reflux disease without esophagitis: K21.9

## 2014-05-17 LAB — LIPASE, BLOOD: Lipase: 2926 U/L — ABNORMAL HIGH (ref 11–59)

## 2014-05-17 LAB — COMPREHENSIVE METABOLIC PANEL
ALK PHOS: 79 U/L (ref 39–117)
ALT: 198 U/L — ABNORMAL HIGH (ref 0–35)
AST: 171 U/L — ABNORMAL HIGH (ref 0–37)
Albumin: 3.6 g/dL (ref 3.5–5.2)
Anion gap: 14 (ref 5–15)
BUN: 12 mg/dL (ref 6–23)
CO2: 22 mEq/L (ref 19–32)
CREATININE: 0.76 mg/dL (ref 0.50–1.10)
Calcium: 9.1 mg/dL (ref 8.4–10.5)
Chloride: 103 mEq/L (ref 96–112)
GFR calc non Af Amer: >90 mL/min (ref >90)
GLUCOSE: 127 mg/dL — AB (ref 70–99)
POTASSIUM: 4.2 meq/L (ref 3.7–5.3)
Sodium: 139 mEq/L (ref 137–147)
TOTAL PROTEIN: 7.6 g/dL (ref 6.0–8.3)
Total Bilirubin: 0.7 mg/dL (ref 0.3–1.2)

## 2014-05-17 LAB — CBC WITH DIFFERENTIAL/PLATELET
BASOS ABS: 0 10*3/uL (ref 0.0–0.1)
Basophils Relative: 0 % (ref 0–1)
Eosinophils Absolute: 0 10*3/uL (ref 0.0–0.7)
Eosinophils Relative: 0 % (ref 0–5)
HCT: 40.9 % (ref 36.0–46.0)
Hemoglobin: 13.6 g/dL (ref 12.0–15.0)
LYMPHS PCT: 8 % — AB (ref 12–46)
Lymphs Abs: 1.1 10*3/uL (ref 0.7–4.0)
MCH: 28.3 pg (ref 26.0–34.0)
MCHC: 33.3 g/dL (ref 30.0–36.0)
MCV: 85 fL (ref 78.0–100.0)
Monocytes Absolute: 0.4 10*3/uL (ref 0.1–1.0)
Monocytes Relative: 3 % (ref 3–12)
NEUTROS ABS: 12.4 10*3/uL — AB (ref 1.7–7.7)
NEUTROS PCT: 89 % — AB (ref 43–77)
Platelets: 297 10*3/uL (ref 150–400)
RBC: 4.81 MIL/uL (ref 3.87–5.11)
RDW: 13 % (ref 11.5–15.5)
WBC: 13.8 10*3/uL — AB (ref 4.0–10.5)

## 2014-05-17 LAB — POCT URINALYSIS DIPSTICK
Glucose, UA: NEGATIVE
KETONES UA: 40
Leukocytes, UA: NEGATIVE
Nitrite, UA: NEGATIVE
PH UA: 5
Protein, UA: 100
SPEC GRAV UA: 1.025
UROBILINOGEN UA: 1

## 2014-05-17 LAB — POCT CBC
Granulocyte percent: 92.4 %G — AB (ref 37–80)
HEMATOCRIT: 42.7 % (ref 37.7–47.9)
Hemoglobin: 13.9 g/dL (ref 12.2–16.2)
Lymph, poc: 0.8 (ref 0.6–3.4)
MCH, POC: 28.4 pg (ref 27–31.2)
MCHC: 32.5 g/dL (ref 31.8–35.4)
MCV: 87.2 fL (ref 80–97)
MID (cbc): 0.1 (ref 0–0.9)
MPV: 7.9 fL (ref 0–99.8)
POC GRANULOCYTE: 11.2 — AB (ref 2–6.9)
POC LYMPH %: 6.4 % — AB (ref 10–50)
POC MID %: 1.2 %M (ref 0–12)
Platelet Count, POC: 284 10*3/uL (ref 142–424)
RBC: 4.9 M/uL (ref 4.04–5.48)
RDW, POC: 13.6 %
WBC: 12.1 10*3/uL — AB (ref 4.6–10.2)

## 2014-05-17 LAB — POCT UA - MICROSCOPIC ONLY
CASTS, UR, LPF, POC: NEGATIVE
Crystals, Ur, HPF, POC: NEGATIVE
YEAST UA: NEGATIVE

## 2014-05-17 LAB — POCT URINE PREGNANCY: PREG TEST UR: NEGATIVE

## 2014-05-17 MED ORDER — SODIUM CHLORIDE 0.9 % IV BOLUS (SEPSIS)
1000.0000 mL | Freq: Once | INTRAVENOUS | Status: AC
  Administered 2014-05-17: 1000 mL via INTRAVENOUS

## 2014-05-17 MED ORDER — CHLORHEXIDINE GLUCONATE 0.12 % MT SOLN
15.0000 mL | Freq: Four times a day (QID) | OROMUCOSAL | Status: DC
  Administered 2014-05-17 – 2014-05-24 (×20): 15 mL via OROMUCOSAL
  Filled 2014-05-17 (×29): qty 15

## 2014-05-17 MED ORDER — HYDROMORPHONE HCL PF 1 MG/ML IJ SOLN
0.5000 mg | INTRAMUSCULAR | Status: DC | PRN
  Administered 2014-05-17 – 2014-05-18 (×4): 0.5 mg via INTRAVENOUS
  Filled 2014-05-17 (×4): qty 1

## 2014-05-17 MED ORDER — HEPARIN SODIUM (PORCINE) 5000 UNIT/ML IJ SOLN
5000.0000 [IU] | Freq: Three times a day (TID) | INTRAMUSCULAR | Status: AC
  Administered 2014-05-17 – 2014-05-22 (×15): 5000 [IU] via SUBCUTANEOUS
  Filled 2014-05-17 (×17): qty 1

## 2014-05-17 MED ORDER — PANTOPRAZOLE SODIUM 40 MG IV SOLR
40.0000 mg | Freq: Every day | INTRAVENOUS | Status: DC
  Administered 2014-05-17: 40 mg via INTRAVENOUS
  Filled 2014-05-17 (×2): qty 40

## 2014-05-17 MED ORDER — DIPHENHYDRAMINE HCL 12.5 MG/5ML PO ELIX: 12.5000 mg | ORAL_SOLUTION | Freq: Four times a day (QID) | ORAL | Status: DC | PRN

## 2014-05-17 MED ORDER — ONDANSETRON HCL 4 MG/2ML IJ SOLN
4.0000 mg | Freq: Once | INTRAMUSCULAR | Status: AC
  Administered 2014-05-17: 4 mg via INTRAVENOUS
  Filled 2014-05-17: qty 2

## 2014-05-17 MED ORDER — KCL IN DEXTROSE-NACL 20-5-0.45 MEQ/L-%-% IV SOLN
INTRAVENOUS | Status: DC
  Administered 2014-05-17 – 2014-05-22 (×12): via INTRAVENOUS
  Administered 2014-05-23: 1000 mL via INTRAVENOUS
  Administered 2014-05-23 – 2014-05-24 (×3): via INTRAVENOUS
  Filled 2014-05-17 (×19): qty 1000

## 2014-05-17 MED ORDER — MORPHINE SULFATE 4 MG/ML IJ SOLN
4.0000 mg | Freq: Once | INTRAMUSCULAR | Status: AC
  Administered 2014-05-17: 4 mg via INTRAVENOUS
  Filled 2014-05-17: qty 1

## 2014-05-17 MED ORDER — DIPHENHYDRAMINE HCL 50 MG/ML IJ SOLN: 12.5000 mg | Freq: Four times a day (QID) | INTRAMUSCULAR | Status: DC | PRN

## 2014-05-17 MED ORDER — SODIUM CHLORIDE 0.9 % IV SOLN
3.0000 g | Freq: Once | INTRAVENOUS | Status: AC
  Administered 2014-05-17: 3 g via INTRAVENOUS
  Filled 2014-05-17 (×2): qty 3

## 2014-05-17 MED ORDER — ONDANSETRON HCL 4 MG/2ML IJ SOLN
4.0000 mg | Freq: Four times a day (QID) | INTRAMUSCULAR | Status: DC | PRN
  Administered 2014-05-17 – 2014-05-22 (×3): 4 mg via INTRAVENOUS
  Filled 2014-05-17 (×3): qty 2

## 2014-05-17 MED ORDER — GADOBENATE DIMEGLUMINE 529 MG/ML IV SOLN
15.0000 mL | Freq: Once | INTRAVENOUS | Status: AC | PRN
  Administered 2014-05-17: 15 mL via INTRAVENOUS

## 2014-05-17 NOTE — ED Provider Notes (Signed)
Patient states in February she was having bad episodes of heartburn and she started taking Nexium. That improved and she stopped taking the Nexium. She states yesterday after eating a [employer] sandwich and chips she started getting heartburn again. She states she had nausea, vomiting, chills and diaphoresis it lasted about one to 2 hours. She had a persistent heartburn and sedation in her central lower chest but about 1 AM started going down into her abdomen. She states she then had abdominal bloating and some right upper quadrant pain.  Patient is alert cooperative, she has dry lips and still appears to be dehydrated. She indicates her pain is in the epigastric and right upper quadrant.  We discussed her test results and need for admission.  Medical screening examination/treatment/procedure(s) were conducted as a shared visit with non-physician practitioner(s) and myself.  I personally evaluated the patient during the encounter.   EKG Interpretation None       Devoria AlbeIva Rosina Cressler, MD, Armando GangFACEP   Ward GivensIva L Symir Mah, MD 05/17/14 [phone]

## 2014-05-17 NOTE — Patient Instructions (Signed)
Please proceed to [facility] for your ultrasound. Wait there until I call you

## 2014-05-17 NOTE — ED Notes (Signed)
PT REQUESTING PAIN MEDS BEFORE GOING TO MRI.

## 2014-05-17 NOTE — ED Notes (Signed)
Called report to nurse Eulah Citizen(Pauline). She said she will call back.

## 2014-05-17 NOTE — Progress Notes (Signed)
[facility] [address], [place] [address] [phone]  Date:  05/17/2014   Name:  Sandy Gilbert   DOB:  09/24/1971   MRN:  [id number]  PCP:  Nilda SimmerSMITH,KRISTI, MD    Chief Complaint: Abdominal Pain, Nausea, Back Pain and Chills   History of Present Illness:  Sandy Gilbert is a 43 y.o. very pleasant female patient who presents with the following:  Here today to evaluate abdominal pain.  Yesterday she noted onset of heartburn around 1pm after lunch.   She tried some zantac which usually helps her with GERD but it did not help this time.    She also has noted chills, sweats, nausea, dizziness.  She notes these sx during the early morning hours today.  Her sx seemed to get worse around 0200 today.   No vomiting but she has been nauseated.  Did not try to eat anything yet today She has not noted a fever, but has felt hot and cold.   She has not eaten anything today.   She has noted some "pressure" in her bladder, but has not noted any dysuria.  LMP was just about one month ago. She is on the nuva- ring and expects her menses soon No surgical history. She has had normal stools recently She is not aware of any family history of gallbladder issues  There are no active problems to display for this patient.   No past medical history on file.  No past surgical history on file.  History  Substance Use Topics  . Smoking status: Former Smoker    Quit date: 03/04/2000  . Smokeless tobacco: Never Used  . Alcohol Use: Yes     Comment: very rare    No family history on file.  Allergies  Allergen Reactions  . Codeine     Medication list has been reviewed and updated.  Current Outpatient Prescriptions on File Prior to Visit  Medication Sig Dispense Refill  . acetaminophen (TYLENOL) 325 MG tablet Take 650 mg by mouth every 6 (six) hours as needed.      . etonogestrel-ethinyl estradiol (NUVARING) 0.12-0.015 MG/24HR vaginal ring Place 1 each vaginally  every 28 (twenty-eight) days. Insert vaginally and leave in place for 3 consecutive weeks, then remove for 1 week.      . sucralfate (CARAFATE) 1 G tablet Take one 1 hr ac and hs  120 tablet  0  . esomeprazole (NEXIUM) 40 MG capsule Take 1 capsule (40 mg total) by mouth daily.  30 capsule  1  . ipratropium (ATROVENT) 0.03 % nasal spray Place 2 sprays into the nose 2 (two) times daily.  30 mL  0  . predniSONE (DELTASONE) 20 MG tablet Take 2 pills a day for 4 days, then 1 pill a day for 4 days  12 tablet  0   No current facility-administered medications on file prior to visit.    Review of Systems:  As per HPI- otherwise negative.    Physical Examination: Filed Vitals:   05/17/14 1011  BP: 122/74  Pulse: 85  Temp: 98.2 F (36.8 C)  Resp: 17   Filed Vitals:   05/17/14 1011  Height: 5' 5.5" (1.664 m)  Weight: 159 lb (72.122 kg)   Body mass index is 26.05 kg/(m^2). Ideal Body Weight: Weight in (lb) to have BMI = 25: 152.2  GEN: WDWN, NAD, Non-toxic, A & O x 3,looks well, here today with her husband HEENT: Atraumatic, Normocephalic. Neck supple.  No masses, No LAD. Ears and Nose: No external deformity. CV: RRR, No M/G/R. No JVD. No thrill. No extra heart sounds. PULM: CTA B, no wheezes, crackles, rhonchi. No retractions. No resp. distress. No accessory muscle use. ABD: S,  ND, +BS. No rebound. No HSM.  She has tenderness all over her abdomen, but most pronounced in the RUQ. Positive murphy's sign EXTR: No c/c/e NEURO Normal gait.  PSYCH: Normally interactive. Conversant. Not depressed or anxious appearing.  Calm demeanor.  GU: normal exam.  No vaginal lesions or discharge. No adnexal tenderness or masses  Results for orders placed in visit on 05/17/14  POCT URINALYSIS DIPSTICK      Result Value Ref Range   Color, UA amber     Clarity, UA clear     Glucose, UA neg     Bilirubin, UA mod     Ketones, UA 40     Spec Grav, UA 1.025     Blood, UA small     pH, UA 5.0      Protein, UA 100     Urobilinogen, UA 1.0     Nitrite, UA neg     Leukocytes, UA Negative    POCT URINE PREGNANCY      Result Value Ref Range   Preg Test, Ur Negative    POCT UA - MICROSCOPIC ONLY      Result Value Ref Range   WBC, Ur, HPF, POC 0-1     RBC, urine, microscopic 2-8     Bacteria, U Microscopic 1+     Mucus, UA trace     Epithelial cells, urine per micros 0-2     Crystals, Ur, HPF, POC neg     Casts, Ur, LPF, POC neg     Yeast, UA neg    POCT CBC      Result Value Ref Range   WBC 12.1 (*) 4.6 - 10.2 K/uL   Lymph, poc 0.8  0.6 - 3.4   POC LYMPH PERCENT 6.4 (*) 10 - 50 %L   MID (cbc) 0.1  0 - 0.9   POC MID % 1.2  0 - 12 %M   POC Granulocyte 11.2 (*) 2 - 6.9   Granulocyte percent 92.4 (*) 37 - 80 %G   RBC 4.90  4.04 - 5.48 M/uL   Hemoglobin 13.9  12.2 - 16.2 g/dL   HCT, POC 16.1  09.6 - 47.9 %   MCV 87.2  80 - 97 fL   MCH, POC 28.4  27 - 31.2 pg   MCHC 32.5  31.8 - 35.4 g/dL   RDW, POC 04.5     Platelet Count, POC 284  142 - 424 K/uL   MPV 7.9  0 - 99.8 fL   Given GI cocktail- this did not resolve her pain.  However on re-exam pain is more localized to the RUQ.    Assessment and Plan: Abdominal pain, epigastric - Plan: POCT urinalysis dipstick, POCT urine pregnancy, POCT UA - Microscopic Only, POCT CBC, Comprehensive metabolic panel, Urine culture, US Abdomen Limited RUQ, US Abdomen Limited RUQ  Sent for an ultrasound for likely gallbladder colic at [facility].  Received report as below:  Urine culture and CMP pending  US ABDOMEN LIMITED - RIGHT UPPER QUADRANT  COMPARISON: No priors.  FINDINGS: Gallbladder:  The gallbladder is nearly completely contracted, and filled with multiple echogenic structures with posterior acoustic shadowing, compatible with gallstones. Gallbladder wall thickness is difficult to judge, but appears within normal limits,  likely 2 mm. No definite pericholecystic fluid. Per report from the sonographer, the patient did not exhibit a  sonographic Murphy's sign on examination.  Common bile duct:  Diameter: 9.3 mm in the porta hepatis. Liver: No focal lesion identified. Within normal limits in parenchymal echogenicity. Probable mild intrahepatic biliary ductal dilatation.  IMPRESSION: 1. Mild intra and extrahepatic biliary ductal dilatation. 2. Cholelithiasis. No definite findings to suggest acute cholecystitis at this time. 3. However, given the presence of stones in the gallbladder, the possibility of a distal stone in the common bile duct is of concern, particularly in light of the biliary tract dilatation. Clinical correlation for signs and symptoms of biliary tract obstruction is recommended. Further evaluation with MRCP may be appropriate if clinically indicated.   Signed Abbe Amsterdam, MD  Called pt and gave her the above report.  She will go to the ED at [facility] for further evaluation.  Alerted EDP as well

## 2014-05-17 NOTE — ED Provider Notes (Signed)
CSN: [id number]     Arrival date & time 05/17/14  1447 History   None    Chief Complaint  Patient presents with  . Cholelithiasis     (Consider location/radiation/quality/duration/timing/severity/associated sxs/prior Treatment) HPI Comments: The patient is a 43 year old female presents from urgent care to be emergency room and chief complaint of right abdominal discomfort since last night.  She reports abdominal discomfort initially started as "reflux" worsened to persistent right upper quadrant discomfort. Worsened with movement, laying down and bending over. Reports occasional radiation to right shoulder with laying down and with palpation of right upper quadrant. Denies relieving factors. The patient reports history of reflux, relieved by nexium.  The patient reports a bad reaction to Nexium and no longer takes it. The patient also reports lower abdominal pain starting yesterday. She reports multiple small bowel movements yesterday. She reports nausea without emesis. Reports chills without fever.  Patient's last menstrual period was 04/16/2014.  Patient reports unusual to have lower normal discomfort around the time of her menses. Denies history of abdominal surgeries.  The history is provided by the patient and medical records. No language interpreter was used.    History reviewed. No pertinent past medical history. History reviewed. No pertinent past surgical history. No family history on file. History  Substance Use Topics  . Smoking status: Former Smoker    Quit date: 03/04/2000  . Smokeless tobacco: Never Used  . Alcohol Use: Yes     Comment: very rare   OB History   Grav Para Term Preterm Abortions TAB SAB Ect Mult Living                 Review of Systems  Constitutional: Positive for chills. Negative for fever.  Gastrointestinal: Positive for nausea, abdominal pain and constipation. Negative for vomiting and diarrhea.  Genitourinary: Negative for dysuria, hematuria, vaginal  bleeding and vaginal discharge.      Allergies  Codeine  Home Medications   Prior to Admission medications   Medication Sig Start Date End Date Taking? Authorizing Provider  acetaminophen (TYLENOL) 325 MG tablet Take 650 mg by mouth every 6 (six) hours as needed.    Historical Provider, MD  esomeprazole (NEXIUM) 40 MG capsule Take 1 capsule (40 mg total) by mouth daily. 11/30/13   Peyton Najjaravid H Hopper, MD  etonogestrel-ethinyl estradiol (NUVARING) 0.12-0.015 MG/24HR vaginal ring Place 1 each vaginally every 28 (twenty-eight) days. Insert vaginally and leave in place for 3 consecutive weeks, then remove for 1 week.    Historical Provider, MD  ipratropium (ATROVENT) 0.03 % nasal spray Place 2 sprays into the nose 2 (two) times daily. 12/24/13   Ethelda ChickKristi M Smith, MD  predniSONE (DELTASONE) 20 MG tablet Take 2 pills a day for 4 days, then 1 pill a day for 4 days 02/02/14   Pearline CablesJessica C Copland, MD  ranitidine (ZANTAC) 150 MG capsule Take 150 mg by mouth 2 (two) times daily.    Historical Provider, MD  sucralfate (CARAFATE) 1 G tablet Take one 1 hr ac and hs 12/19/13   Phillips OdorJeffery Anderson, MD   BP 122/79  Pulse 84  Temp(Src) 98.9 F (37.2 C) (Oral)  Resp 18  SpO2 100%  LMP 04/16/2014 Physical Exam  Nursing note and vitals reviewed. Constitutional: She is oriented to person, place, and time. She appears well-developed and well-nourished. No distress.  HENT:  Head: Normocephalic and atraumatic.  Eyes: EOM are normal.  Neck: Neck supple.  Cardiovascular: Normal rate and regular rhythm.   Pulmonary/Chest: Effort  normal. No respiratory distress. She has no wheezes. She has no rales.  Abdominal: Soft. She exhibits no distension. Bowel sounds are decreased. There is tenderness in the right upper quadrant and epigastric area. There is guarding. There is no rebound, no CVA tenderness and negative Murphy's sign.  Neurological: She is alert and oriented to person, place, and time.  Skin: Skin is warm and dry. She  is not diaphoretic.  Psychiatric: She has a normal mood and affect. Her behavior is normal.    ED Course  Procedures (including critical care time) Labs Review Results for orders placed during the hospital encounter of 05/17/14  COMPREHENSIVE METABOLIC PANEL      Result Value Ref Range   Sodium 139  137 - 147 mEq/L   Potassium 4.2  3.7 - 5.3 mEq/L   Chloride 103  96 - 112 mEq/L   CO2 22  19 - 32 mEq/L   Glucose, Bld 127 (*) 70 - 99 mg/dL   BUN 12  6 - 23 mg/dL   Creatinine, Ser 1.61  0.50 - 1.10 mg/dL   Calcium 9.1  8.4 - 09.6 mg/dL   Total Protein 7.6  6.0 - 8.3 g/dL   Albumin 3.6  3.5 - 5.2 g/dL   AST 045 (*) 0 - 37 U/L   ALT 198 (*) 0 - 35 U/L   Alkaline Phosphatase 79  39 - 117 U/L   Total Bilirubin 0.7  0.3 - 1.2 mg/dL   GFR calc non Af Amer >90  >90 mL/min   GFR calc Af Amer >90  >90 mL/min   Anion gap 14  5 - 15  CBC WITH DIFFERENTIAL      Result Value Ref Range   WBC 13.8 (*) 4.0 - 10.5 K/uL   RBC 4.81  3.87 - 5.11 MIL/uL   Hemoglobin 13.6  12.0 - 15.0 g/dL   HCT 40.9  81.1 - 91.4 %   MCV 85.0  78.0 - 100.0 fL   MCH 28.3  26.0 - 34.0 pg   MCHC 33.3  30.0 - 36.0 g/dL   RDW 78.2  95.6 - 21.3 %   Platelets 297  150 - 400 K/uL   Neutrophils Relative % 89 (*) 43 - 77 %   Neutro Abs 12.4 (*) 1.7 - 7.7 K/uL   Lymphocytes Relative 8 (*) 12 - 46 %   Lymphs Abs 1.1  0.7 - 4.0 K/uL   Monocytes Relative 3  3 - 12 %   Monocytes Absolute 0.4  0.1 - 1.0 K/uL   Eosinophils Relative 0  0 - 5 %   Eosinophils Absolute 0.0  0.0 - 0.7 K/uL   Basophils Relative 0  0 - 1 %   Basophils Absolute 0.0  0.0 - 0.1 K/uL  LIPASE, BLOOD      Result Value Ref Range   Lipase 2926 (*) 11 - 59 U/L   US Abdomen Limited Ruq  05/17/2014   CLINICAL DATA:  Epigastric abdominal pain.  EXAM: US ABDOMEN LIMITED - RIGHT UPPER QUADRANT  COMPARISON:  No priors.  FINDINGS: Gallbladder:  The gallbladder is nearly completely contracted, and filled with multiple echogenic structures with posterior acoustic  shadowing, compatible with gallstones. Gallbladder wall thickness is difficult to judge, but appears within normal limits, likely 2 mm. No definite pericholecystic fluid. Per report from the sonographer, the patient did not exhibit a sonographic Murphy's sign on examination.  Common bile duct:  Diameter: 9.3 mm in the porta hepatis.  Liver:  No focal lesion identified. Within normal limits in parenchymal echogenicity. Probable mild intrahepatic biliary ductal dilatation.  IMPRESSION: 1. Mild intra and extrahepatic biliary ductal dilatation. 2. Cholelithiasis. No definite findings to suggest acute cholecystitis at this time. 3. However, given the presence of stones in the gallbladder, the possibility of a distal stone in the common bile duct is of concern, particularly in light of the biliary tract dilatation. Clinical correlation for signs and symptoms of biliary tract obstruction is recommended. Further evaluation with MRCP may be appropriate if clinically indicated.   Electronically Signed   By: Trudie Reed M.D.   On: 05/17/2014 14:12    EKG Interpretation None      MDM   Final diagnoses:  Acute pancreatitis, unspecified pancreatitis type  Gall stones   Patient presents with abdominal abdominal ultrasound, biliary tract dilation seen, no obvious stone and tract, multiple stones in the gallbladder.  Patient has right upper quadrant discomfort on exam, no obvious Murphy sign. Also complains of low generalized abdominal pain control constipation first menstrual pain. Ouside UA shows 1+ bacteria, 2-8 Hbg, 40 ketones on urine. CMP shows AS, ALT elevation. Lipase 2926. ERCP ordered. Discussed pt history, condition, results with Dr. Daphine Deutscher who agrees to evaluate the patient in the ED. Dr. Daphine Deutscher to admit the patient.   Mellody Drown, PA-C 05/18/14 [phone]

## 2014-05-17 NOTE — ED Notes (Signed)
Per pt, states heartburn/abdominal pain since last night-US today and has possible blockage

## 2014-05-17 NOTE — H&P (Signed)
Chief Complaint:  Epigastric pain radiating into the back with lipase > 2000  History of Present Illness:  Sandy Gilbert is an 43 y.o. female with a two day history of abdominal pain and nausea who presented to the ER for evaluation.  Ultrasound shows gallstones but lipase is 2926.  She is admitted with a presumed diagnosis of gallstone pancreatitis.    Past Medical History  Diagnosis Date  . GERD (gastroesophageal reflux disease)     History reviewed. No pertinent past surgical history.  Current Facility-Administered Medications  Medication Dose Route Frequency Provider Last Rate Last Dose  . Ampicillin-Sulbactam (UNASYN) 3 g in sodium chloride 0.9 % 100 mL IVPB  3 g Intravenous Once Janice Norrie, MD 100 mL/hr at 05/17/14 1639 3 g at 05/17/14 1639   Current Outpatient Prescriptions  Medication Sig Dispense Refill  . acetaminophen (TYLENOL) 325 MG tablet Take 650 mg by mouth every 6 (six) hours as needed (pain.).       Marland Kitchen etonogestrel-ethinyl estradiol (NUVARING) 0.12-0.015 MG/24HR vaginal ring Place 1 each vaginally every 28 (twenty-eight) days. Insert vaginally and leave in place for 3 consecutive weeks, then remove for 1 week.      . ranitidine (ZANTAC) 150 MG capsule Take 150 mg by mouth 2 (two) times daily.      . sucralfate (CARAFATE) 1 G tablet 1 g 2 (two) times daily. Take one 1 hr ac and hs       Codeine No family history on file. Social History:   reports that she quit smoking about 14 years ago. She has never used smokeless tobacco. She reports that she drinks alcohol. She reports that she does not use illicit drugs.   REVIEW OF SYSTEMS : Negative except for reflux symptoms since earlier this year.    Physical Exam:   Blood pressure 122/79, pulse 84, temperature 98.9 F (37.2 C), temperature source Oral, resp. rate 18, last menstrual period 04/16/2014, SpO2 100.00%. There is no weight on file to calculate BMI.  Gen:  WDWN WF NAD  Neurological: Alert and oriented to  person, place, and time. Motor and sensory function is grossly intact  Head: Normocephalic and atraumatic.  Eyes: Conjunctivae are normal. Pupils are equal, round, and reactive to light. No scleral icterus.  Neck: Normal range of motion. Neck supple. No tracheal deviation or thyromegaly present.  Cardiovascular:  SR without murmurs or gallops.  No carotid bruits Breast:  Not examined Respiratory: Effort normal.  No respiratory distress. No chest wall tenderness. Breath sounds normal.  No wheezes, rales or rhonchi.  Abdomen:  Tender in midepigastrium without rebound or guarding.   GU:  Not examined Musculoskeletal: Normal range of motion. Extremities are nontender. No cyanosis, edema or clubbing noted Lymphadenopathy: No cervical, preauricular, postauricular or axillary adenopathy is present Skin: Skin is warm and dry. No rash noted. No diaphoresis. No erythema. No pallor. Pscyh: Normal mood and affect. Behavior is normal. Judgment and thought content normal.   LABORATORY RESULTS: Results for orders placed during the hospital encounter of 05/17/14 (from the past 48 hour(s))  COMPREHENSIVE METABOLIC PANEL     Status: Abnormal   Collection Time    05/17/14  3:16 PM      Result Value Ref Range   Sodium 139  137 - 147 mEq/L   Potassium 4.2  3.7 - 5.3 mEq/L   Chloride 103  96 - 112 mEq/L   CO2 22  19 - 32 mEq/L   Glucose, Bld 127 (*) 70 -  99 mg/dL   BUN 12  6 - 23 mg/dL   Creatinine, Ser 7.40  0.50 - 1.10 mg/dL   Calcium 9.1  8.4 - 02.1 mg/dL   Total Protein 7.6  6.0 - 8.3 g/dL   Albumin 3.6  3.5 - 5.2 g/dL   AST 406 (*) 0 - 37 U/L   ALT 198 (*) 0 - 35 U/L   Alkaline Phosphatase 79  39 - 117 U/L   Total Bilirubin 0.7  0.3 - 1.2 mg/dL   GFR calc non Af Amer >90  >90 mL/min   GFR calc Af Amer >90  >90 mL/min   Comment: (NOTE)     The eGFR has been calculated using the CKD EPI equation.     This calculation has not been validated in all clinical situations.     eGFR's persistently <90  mL/min signify possible Chronic Kidney     Disease.   Anion gap 14  5 - 15  CBC WITH DIFFERENTIAL     Status: Abnormal   Collection Time    05/17/14  3:16 PM      Result Value Ref Range   WBC 13.8 (*) 4.0 - 10.5 K/uL   RBC 4.81  3.87 - 5.11 MIL/uL   Hemoglobin 13.6  12.0 - 15.0 g/dL   HCT 91.4  58.8 - 69.1 %   MCV 85.0  78.0 - 100.0 fL   MCH 28.3  26.0 - 34.0 pg   MCHC 33.3  30.0 - 36.0 g/dL   RDW 91.2  44.3 - 90.9 %   Platelets 297  150 - 400 K/uL   Neutrophils Relative % 89 (*) 43 - 77 %   Neutro Abs 12.4 (*) 1.7 - 7.7 K/uL   Lymphocytes Relative 8 (*) 12 - 46 %   Lymphs Abs 1.1  0.7 - 4.0 K/uL   Monocytes Relative 3  3 - 12 %   Monocytes Absolute 0.4  0.1 - 1.0 K/uL   Eosinophils Relative 0  0 - 5 %   Eosinophils Absolute 0.0  0.0 - 0.7 K/uL   Basophils Relative 0  0 - 1 %   Basophils Absolute 0.0  0.0 - 0.1 K/uL  LIPASE, BLOOD     Status: Abnormal   Collection Time    05/17/14  3:16 PM      Result Value Ref Range   Lipase 2926 (*) 11 - 59 U/L     RADIOLOGY RESULTS: US Abdomen Limited Ruq  05/17/2014   CLINICAL DATA:  Epigastric abdominal pain.  EXAM: US ABDOMEN LIMITED - RIGHT UPPER QUADRANT  COMPARISON:  No priors.  FINDINGS: Gallbladder:  The gallbladder is nearly completely contracted, and filled with multiple echogenic structures with posterior acoustic shadowing, compatible with gallstones. Gallbladder wall thickness is difficult to judge, but appears within normal limits, likely 2 mm. No definite pericholecystic fluid. Per report from the sonographer, the patient did not exhibit a sonographic Murphy's sign on examination.  Common bile duct:  Diameter: 9.3 mm in the porta hepatis.  Liver:  No focal lesion identified. Within normal limits in parenchymal echogenicity. Probable mild intrahepatic biliary ductal dilatation.  IMPRESSION: 1. Mild intra and extrahepatic biliary ductal dilatation. 2. Cholelithiasis. No definite findings to suggest acute cholecystitis at this time.  3. However, given the presence of stones in the gallbladder, the possibility of a distal stone in the common bile duct is of concern, particularly in light of the biliary tract dilatation. Clinical correlation for signs and  symptoms of biliary tract obstruction is recommended. Further evaluation with MRCP may be appropriate if clinically indicated.   Electronically Signed   By: Vinnie Langton M.D.   On: 05/17/2014 14:12    Problem List: Patient Active Problem List   Diagnosis Date Noted  . Pancreatitis 05/17/2014  . Gallstones 05/17/2014    Assessment & Plan: Gallstone pancreatitis.  Admit for hydration and cooling off of pancreatitis and consideration for cholecystectomy    Matt B. Hassell Done, MD, [facility] [phone] beeper [phone]  05/17/2014 4:54 PM

## 2014-05-17 NOTE — ED Notes (Signed)
Surgeon at bedside.  

## 2014-05-18 LAB — COMPREHENSIVE METABOLIC PANEL
ALBUMIN: 4.1 g/dL (ref 3.5–5.2)
ALK PHOS: 69 U/L (ref 39–117)
ALK PHOS: 64 U/L (ref 39–117)
ALT: 200 U/L — ABNORMAL HIGH (ref 0–35)
ALT: 117 U/L — ABNORMAL HIGH (ref 0–35)
AST: 199 U/L — ABNORMAL HIGH (ref 0–37)
AST: 76 U/L — AB (ref 0–37)
Albumin: 2.8 g/dL — ABNORMAL LOW (ref 3.5–5.2)
Anion gap: 9 (ref 5–15)
BILIRUBIN TOTAL: 0.6 mg/dL (ref 0.3–1.2)
BUN: 12 mg/dL (ref 6–23)
BUN: 10 mg/dL (ref 6–23)
CALCIUM: 9.2 mg/dL (ref 8.4–10.5)
CHLORIDE: 104 meq/L (ref 96–112)
CHLORIDE: 106 meq/L (ref 96–112)
CO2: 24 meq/L (ref 19–32)
CO2: 24 mEq/L (ref 19–32)
CREATININE: 0.74 mg/dL (ref 0.50–1.10)
Calcium: 8.1 mg/dL — ABNORMAL LOW (ref 8.4–10.5)
Creat: 0.76 mg/dL (ref 0.50–1.10)
GFR calc Af Amer: >90 mL/min (ref >90)
GFR calc non Af Amer: >90 mL/min (ref >90)
GLUCOSE: 132 mg/dL — AB (ref 70–99)
Glucose, Bld: 118 mg/dL — ABNORMAL HIGH (ref 70–99)
POTASSIUM: 4.1 meq/L (ref 3.5–5.3)
POTASSIUM: 4 meq/L (ref 3.7–5.3)
Sodium: 138 mEq/L (ref 135–145)
Sodium: 139 mEq/L (ref 137–147)
TOTAL PROTEIN: 7 g/dL (ref 6.0–8.3)
Total Bilirubin: 0.9 mg/dL (ref 0.2–1.2)
Total Protein: 6.2 g/dL (ref 6.0–8.3)

## 2014-05-18 LAB — CBC WITH DIFFERENTIAL/PLATELET
BASOS ABS: 0 10*3/uL (ref 0.0–0.1)
Basophils Relative: 0 % (ref 0–1)
EOS ABS: 0.1 10*3/uL (ref 0.0–0.7)
Eosinophils Relative: 1 % (ref 0–5)
HCT: 38.6 % (ref 36.0–46.0)
Hemoglobin: 12.2 g/dL (ref 12.0–15.0)
Lymphocytes Relative: 12 % (ref 12–46)
Lymphs Abs: 1.7 10*3/uL (ref 0.7–4.0)
MCH: 27.7 pg (ref 26.0–34.0)
MCHC: 31.6 g/dL (ref 30.0–36.0)
MCV: 87.7 fL (ref 78.0–100.0)
Monocytes Absolute: 0.6 10*3/uL (ref 0.1–1.0)
Monocytes Relative: 4 % (ref 3–12)
NEUTROS ABS: 12.5 10*3/uL — AB (ref 1.7–7.7)
Neutrophils Relative %: 83 % — ABNORMAL HIGH (ref 43–77)
Platelets: 249 10*3/uL (ref 150–400)
RBC: 4.4 MIL/uL (ref 3.87–5.11)
RDW: 13.2 % (ref 11.5–15.5)
WBC: 15 10*3/uL — ABNORMAL HIGH (ref 4.0–10.5)

## 2014-05-18 LAB — URINE CULTURE

## 2014-05-18 LAB — LIPASE, BLOOD: LIPASE: 1736 U/L — AB (ref 11–59)

## 2014-05-18 MED ORDER — HYDROMORPHONE HCL PF 1 MG/ML IJ SOLN
0.5000 mg | INTRAMUSCULAR | Status: DC | PRN
  Administered 2014-05-18 – 2014-05-20 (×14): 1 mg via INTRAVENOUS
  Administered 2014-05-20: 0.5 mg via INTRAVENOUS
  Administered 2014-05-20 – 2014-05-23 (×11): 1 mg via INTRAVENOUS
  Filled 2014-05-18 (×26): qty 1

## 2014-05-18 MED ORDER — PANTOPRAZOLE SODIUM 40 MG IV SOLR
40.0000 mg | Freq: Two times a day (BID) | INTRAVENOUS | Status: DC
  Administered 2014-05-18 – 2014-05-21 (×8): 40 mg via INTRAVENOUS
  Filled 2014-05-18 (×10): qty 40

## 2014-05-18 NOTE — Progress Notes (Signed)
Subjective: Still tender and sore.  Having pain, Morphine helps but doesn't last to long.    Objective: Vital signs in last 24 hours: Temp:  [97.3 F (36.3 C)-98.9 F (37.2 C)] 98.4 F (36.9 C) (08/06 0549) Pulse Rate:  [66-85] 84 (08/06 0549) Resp:  [15-18] 16 (08/06 0549) BP: (103-136)/(58-81) 103/67 mmHg (08/06 0549) SpO2:  [97 %-100 %] 98 % (08/06 0549) Weight:  [72.122 kg (159 lb)-78.7 kg (173 lb 8 oz)] 78.7 kg (173 lb 8 oz) (08/05 2100) Last BM Date: 05/16/14 Npo Afebrile, VSS WBC up and lipase is down some MRI shows acute pancreatitis no drain able collections or pseudocyst, no pancreatic necrosis, +cholelithisis with GB distension, no cholecystitis Intake/Output from previous day: 08/05 0701 - 08/06 0700 In: 2195.8 [I.V.:2095.8; IV Piggyback:100] Out: 1300 [Urine:1300] Intake/Output this shift:    General appearance: alert, cooperative and no distress GI: soft tender and having pain mid abdomen.  Lab Results:   Recent Labs  05/17/14 1516 05/18/14 0422  WBC 13.8* 15.0*  HGB 13.6 12.2  HCT 40.9 38.6  PLT 297 249    BMET  Recent Labs  05/17/14 1516 05/18/14 0422  NA 139 139  K 4.2 4.0  CL 103 106  CO2 22 24  GLUCOSE 127* 118*  BUN 12 10  CREATININE 0.76 0.74  CALCIUM 9.1 8.1*   PT/INR No results found for this basename: LABPROT, INR,  in the last 72 hours   Recent Labs Lab 05/17/14 1204 05/17/14 1516 05/18/14 0422  AST 199* 171* 76*  ALT 200* 198* 117*  ALKPHOS 69 79 64  BILITOT 0.9 0.7 0.6  PROT 7.0 7.6 6.2  ALBUMIN 4.1 3.6 2.8*     Lipase     Component Value Date/Time   LIPASE 1736* 05/18/2014 0422     Studies/Results: Mr 3d Recon At Scanner  05/18/2014   CLINICAL DATA:  Abdominal pain, nausea, chills, biliary dilatation  EXAM: MRI ABDOMEN WITHOUT AND WITH CONTRAST (INCLUDING MRCP)  TECHNIQUE: Multiplanar multisequence MR imaging of the abdomen was performed both before and after the administration of intravenous contrast.  Heavily T2-weighted images of the biliary and pancreatic ducts were obtained, and three-dimensional MRCP images were rendered by post processing.  CONTRAST:  15mL MULTIHANCE GADOBENATE DIMEGLUMINE 529 MG/ML IV SOLN  COMPARISON:  Abdominal ultrasound dated 05/17/2014  FINDINGS: Liver is within normal limits. No suspicious/ enhancing hepatic lesions. No hepatic steatosis.  Spleen and adrenal glands are within normal limits.  Enlargement of the pancreas with associated peripancreatic fluid/inflammatory changes, particularly along the pancreatic body/tail, suggesting acute pancreatitis. No drainable fluid collection/pseudocyst. No evidence of pancreatic divisum.  Numerous layering gallstones with mild gallbladder distention, without gallbladder wall thickening/inflammatory changes. No intrahepatic duct dilatation. Dilated common duct, measuring up to 14 mm centrally (series 5/ image 51), smoothly tapering to 7 mm distally. No choledocholithiasis is seen.  Kidneys are notable for a dominant 2.8 x 3.2 cm cyst in the anterior interpolar left kidney (series 15/ image 27). No hydronephrosis.  Mild to moderate abdominal ascites.  No suspicious abdominal lymphadenopathy.  No focal osseous lesions.  IMPRESSION: Acute pancreatitis. No drainable fluid collection/pseudocyst. No evidence of pancreatic necrosis.  Cholelithiasis with mild gallbladder distention. No associated findings to suggest acute cholecystitis.  Dilated common duct, measuring up to 14 mm, tapering to 7 mm distally. No choledocholithiasis is seen.   Electronically Signed   By: Charline Bills M.D.   On: 05/18/2014 08:15   Mr Roe Coombs W/wo Cm/mrcp  05/18/2014   CLINICAL  DATA:  Abdominal pain, nausea, chills, biliary dilatation  EXAM: MRI ABDOMEN WITHOUT AND WITH CONTRAST (INCLUDING MRCP)  TECHNIQUE: Multiplanar multisequence MR imaging of the abdomen was performed both before and after the administration of intravenous contrast. Heavily T2-weighted images of the  biliary and pancreatic ducts were obtained, and three-dimensional MRCP images were rendered by post processing.  CONTRAST:  15mL MULTIHANCE GADOBENATE DIMEGLUMINE 529 MG/ML IV SOLN  COMPARISON:  Abdominal ultrasound dated 05/17/2014  FINDINGS: Liver is within normal limits. No suspicious/ enhancing hepatic lesions. No hepatic steatosis.  Spleen and adrenal glands are within normal limits.  Enlargement of the pancreas with associated peripancreatic fluid/inflammatory changes, particularly along the pancreatic body/tail, suggesting acute pancreatitis. No drainable fluid collection/pseudocyst. No evidence of pancreatic divisum.  Numerous layering gallstones with mild gallbladder distention, without gallbladder wall thickening/inflammatory changes. No intrahepatic duct dilatation. Dilated common duct, measuring up to 14 mm centrally (series 5/ image 51), smoothly tapering to 7 mm distally. No choledocholithiasis is seen.  Kidneys are notable for a dominant 2.8 x 3.2 cm cyst in the anterior interpolar left kidney (series 15/ image 27). No hydronephrosis.  Mild to moderate abdominal ascites.  No suspicious abdominal lymphadenopathy.  No focal osseous lesions.  IMPRESSION: Acute pancreatitis. No drainable fluid collection/pseudocyst. No evidence of pancreatic necrosis.  Cholelithiasis with mild gallbladder distention. No associated findings to suggest acute cholecystitis.  Dilated common duct, measuring up to 14 mm, tapering to 7 mm distally. No choledocholithiasis is seen.   Electronically Signed   By: Charline Bills M.D.   On: 05/18/2014 08:15   US Abdomen Limited Ruq  05/17/2014   CLINICAL DATA:  Epigastric abdominal pain.  EXAM: US ABDOMEN LIMITED - RIGHT UPPER QUADRANT  COMPARISON:  No priors.  FINDINGS: Gallbladder:  The gallbladder is nearly completely contracted, and filled with multiple echogenic structures with posterior acoustic shadowing, compatible with gallstones. Gallbladder wall thickness is difficult  to judge, but appears within normal limits, likely 2 mm. No definite pericholecystic fluid. Per report from the sonographer, the patient did not exhibit a sonographic Murphy's sign on examination.  Common bile duct:  Diameter: 9.3 mm in the porta hepatis.  Liver:  No focal lesion identified. Within normal limits in parenchymal echogenicity. Probable mild intrahepatic biliary ductal dilatation.  IMPRESSION: 1. Mild intra and extrahepatic biliary ductal dilatation. 2. Cholelithiasis. No definite findings to suggest acute cholecystitis at this time. 3. However, given the presence of stones in the gallbladder, the possibility of a distal stone in the common bile duct is of concern, particularly in light of the biliary tract dilatation. Clinical correlation for signs and symptoms of biliary tract obstruction is recommended. Further evaluation with MRCP may be appropriate if clinically indicated.   Electronically Signed   By: Trudie Reed M.D.   On: 05/17/2014 14:12    Medications: . chlorhexidine  15 mL Mouth/Throat QID  . heparin  5,000 Units Subcutaneous 3 times per day  . pantoprazole (PROTONIX) IV  40 mg Intravenous QHS   . dextrose 5 % and 0.45 % NaCl with KCl 20 mEq/L 125 mL/hr at 05/18/14 0428   Prior to Admission medications   Medication Sig Start Date End Date Taking? Authorizing Provider  acetaminophen (TYLENOL) 325 MG tablet Take 650 mg by mouth every 6 (six) hours as needed (pain.).    Yes Historical Provider, MD  etonogestrel-ethinyl estradiol (NUVARING) 0.12-0.015 MG/24HR vaginal ring Place 1 each vaginally every 28 (twenty-eight) days. Insert vaginally and leave in place for 3 consecutive weeks, then  remove for 1 week.   Yes Historical Provider, MD  ranitidine (ZANTAC) 150 MG capsule Take 150 mg by mouth 2 (two) times daily.   Yes Historical Provider, MD  sucralfate (CARAFATE) 1 G tablet 1 g 2 (two) times daily. Take one 1 hr ac and hs 12/19/13  Yes Phillips OdorJeffery Anderson, MD     Assessment/Plan Gallstone pancreatitis Hx of tobacco use GERD   Plan:  Continue bowel rest, hydration,  pain relief.  Recheck labs in AM.    LOS: 1 day    Sandy Gilbert 05/18/2014

## 2014-05-18 NOTE — Care Management Note (Signed)
    Page 1 of 1   05/18/2014     11:28:36 AM CARE MANAGEMENT NOTE 05/18/2014  Patient:  Sandy GrahamHOCKER,Sandy G   Account Number:  [id number]  Date Initiated:  05/18/2014  Documentation initiated by:  Lorenda IshiharaPEELE,Collyns Mcquigg  Subjective/Objective Assessment:   43 yo female admitted with pancreatitis. PTA lived at home with spouse.     Action/Plan:   Home when stable   Anticipated DC Date:  05/21/2014   Anticipated DC Plan:  HOME/SELF CARE      DC Planning Services  CM consult      Choice offered to / List presented to:             Status of service:  Completed, signed off Medicare Important Message given?   (If response is "NO", the following Medicare IM given date fields will be blank) Date Medicare IM given:   Medicare IM given by:   Date Additional Medicare IM given:   Additional Medicare IM given by:    Discharge Disposition:  HOME/SELF CARE  Per UR Regulation:  Reviewed for med. necessity/level of care/duration of stay  If discussed at Long Length of Stay Meetings, dates discussed:    Comments:

## 2014-05-19 LAB — CBC
HCT: 34.2 % — ABNORMAL LOW (ref 36.0–46.0)
HEMOGLOBIN: 11 g/dL — AB (ref 12.0–15.0)
MCH: 28.4 pg (ref 26.0–34.0)
MCHC: 32.2 g/dL (ref 30.0–36.0)
MCV: 88.4 fL (ref 78.0–100.0)
Platelets: 205 10*3/uL (ref 150–400)
RBC: 3.87 MIL/uL (ref 3.87–5.11)
RDW: 13.2 % (ref 11.5–15.5)
WBC: 14.9 10*3/uL — ABNORMAL HIGH (ref 4.0–10.5)

## 2014-05-19 LAB — LIPASE, BLOOD: LIPASE: 496 U/L — AB (ref 11–59)

## 2014-05-19 LAB — COMPREHENSIVE METABOLIC PANEL
ALK PHOS: 90 U/L (ref 39–117)
ALT: 63 U/L — AB (ref 0–35)
ANION GAP: 7 (ref 5–15)
AST: 27 U/L (ref 0–37)
Albumin: 2.3 g/dL — ABNORMAL LOW (ref 3.5–5.2)
BILIRUBIN TOTAL: 0.5 mg/dL (ref 0.3–1.2)
BUN: 4 mg/dL — ABNORMAL LOW (ref 6–23)
CO2: 25 meq/L (ref 19–32)
Calcium: 8 mg/dL — ABNORMAL LOW (ref 8.4–10.5)
Chloride: 102 mEq/L (ref 96–112)
Creatinine, Ser: 0.65 mg/dL (ref 0.50–1.10)
GLUCOSE: 115 mg/dL — AB (ref 70–99)
POTASSIUM: 4.4 meq/L (ref 3.7–5.3)
SODIUM: 134 meq/L — AB (ref 137–147)
Total Protein: 5.6 g/dL — ABNORMAL LOW (ref 6.0–8.3)

## 2014-05-19 NOTE — Progress Notes (Signed)
Subjective: She feels better but is still having pain Mid abdomen, RUQ and some up to the left chest.  Objective: Vital signs in last 24 hours: Temp:  [98 F (36.7 C)-99.2 F (37.3 C)] 98 F (36.7 C) (08/07 0645) Pulse Rate:  [85-94] 85 (08/07 0645) Resp:  [18] 18 (08/07 0645) BP: (105-112)/(59-71) 105/71 mmHg (08/07 0645) SpO2:  [97 %-98 %] 98 % (08/07 0645) Last BM Date: 05/16/14 Afebrile, VSS WBC still up, lipase is down to 496 today, 1736 yesterday, 2926 05/17/14  Intake/Output from previous day: 08/06 0701 - 08/07 0700 In: 3050 [I.V.:3050] Out: 1875 [Urine:1875] Intake/Output this shift:    General appearance: alert, cooperative and no distress GI: soft tender, no peritonitis, pain in the RUQ and mid abdomen  Lab Results:   Recent Labs  05/18/14 0422 05/19/14 0455  WBC 15.0* 14.9*  HGB 12.2 11.0*  HCT 38.6 34.2*  PLT 249 205    BMET  Recent Labs  05/18/14 0422 05/19/14 0455  NA 139 134*  K 4.0 4.4  CL 106 102  CO2 24 25  GLUCOSE 118* 115*  BUN 10 4*  CREATININE 0.74 0.65  CALCIUM 8.1* 8.0*   PT/INR No results found for this basename: LABPROT, INR,  in the last 72 hours   Recent Labs Lab 05/17/14 1204 05/17/14 1516 05/18/14 0422 05/19/14 0455  AST 199* 171* 76* 27  ALT 200* 198* 117* 63*  ALKPHOS 69 79 64 90  BILITOT 0.9 0.7 0.6 0.5  PROT 7.0 7.6 6.2 5.6*  ALBUMIN 4.1 3.6 2.8* 2.3*     Lipase     Component Value Date/Time   LIPASE 496* 05/19/2014 0455     Studies/Results: Mr 3d Recon At Scanner  05/18/2014   CLINICAL DATA:  Abdominal pain, nausea, chills, biliary dilatation  EXAM: MRI ABDOMEN WITHOUT AND WITH CONTRAST (INCLUDING MRCP)  TECHNIQUE: Multiplanar multisequence MR imaging of the abdomen was performed both before and after the administration of intravenous contrast. Heavily T2-weighted images of the biliary and pancreatic ducts were obtained, and three-dimensional MRCP images were rendered by post processing.  CONTRAST:   15mL MULTIHANCE GADOBENATE DIMEGLUMINE 529 MG/ML IV SOLN  COMPARISON:  Abdominal ultrasound dated 05/17/2014  FINDINGS: Liver is within normal limits. No suspicious/ enhancing hepatic lesions. No hepatic steatosis.  Spleen and adrenal glands are within normal limits.  Enlargement of the pancreas with associated peripancreatic fluid/inflammatory changes, particularly along the pancreatic body/tail, suggesting acute pancreatitis. No drainable fluid collection/pseudocyst. No evidence of pancreatic divisum.  Numerous layering gallstones with mild gallbladder distention, without gallbladder wall thickening/inflammatory changes. No intrahepatic duct dilatation. Dilated common duct, measuring up to 14 mm centrally (series 5/ image 51), smoothly tapering to 7 mm distally. No choledocholithiasis is seen.  Kidneys are notable for a dominant 2.8 x 3.2 cm cyst in the anterior interpolar left kidney (series 15/ image 27). No hydronephrosis.  Mild to moderate abdominal ascites.  No suspicious abdominal lymphadenopathy.  No focal osseous lesions.  IMPRESSION: Acute pancreatitis. No drainable fluid collection/pseudocyst. No evidence of pancreatic necrosis.  Cholelithiasis with mild gallbladder distention. No associated findings to suggest acute cholecystitis.  Dilated common duct, measuring up to 14 mm, tapering to 7 mm distally. No choledocholithiasis is seen.   Electronically Signed   By: Charline BillsSriyesh  Krishnan M.D.   On: 05/18/2014 08:15   Mr Abd W/wo Cm/mrcp  05/18/2014   CLINICAL DATA:  Abdominal pain, nausea, chills, biliary dilatation  EXAM: MRI ABDOMEN WITHOUT AND WITH CONTRAST (INCLUDING MRCP)  TECHNIQUE:  Multiplanar multisequence MR imaging of the abdomen was performed both before and after the administration of intravenous contrast. Heavily T2-weighted images of the biliary and pancreatic ducts were obtained, and three-dimensional MRCP images were rendered by post processing.  CONTRAST:  15mL MULTIHANCE GADOBENATE  DIMEGLUMINE 529 MG/ML IV SOLN  COMPARISON:  Abdominal ultrasound dated 05/17/2014  FINDINGS: Liver is within normal limits. No suspicious/ enhancing hepatic lesions. No hepatic steatosis.  Spleen and adrenal glands are within normal limits.  Enlargement of the pancreas with associated peripancreatic fluid/inflammatory changes, particularly along the pancreatic body/tail, suggesting acute pancreatitis. No drainable fluid collection/pseudocyst. No evidence of pancreatic divisum.  Numerous layering gallstones with mild gallbladder distention, without gallbladder wall thickening/inflammatory changes. No intrahepatic duct dilatation. Dilated common duct, measuring up to 14 mm centrally (series 5/ image 51), smoothly tapering to 7 mm distally. No choledocholithiasis is seen.  Kidneys are notable for a dominant 2.8 x 3.2 cm cyst in the anterior interpolar left kidney (series 15/ image 27). No hydronephrosis.  Mild to moderate abdominal ascites.  No suspicious abdominal lymphadenopathy.  No focal osseous lesions.  IMPRESSION: Acute pancreatitis. No drainable fluid collection/pseudocyst. No evidence of pancreatic necrosis.  Cholelithiasis with mild gallbladder distention. No associated findings to suggest acute cholecystitis.  Dilated common duct, measuring up to 14 mm, tapering to 7 mm distally. No choledocholithiasis is seen.   Electronically Signed   By: Charline Bills M.D.   On: 05/18/2014 08:15   US Abdomen Limited Ruq  05/17/2014   CLINICAL DATA:  Epigastric abdominal pain.  EXAM: US ABDOMEN LIMITED - RIGHT UPPER QUADRANT  COMPARISON:  No priors.  FINDINGS: Gallbladder:  The gallbladder is nearly completely contracted, and filled with multiple echogenic structures with posterior acoustic shadowing, compatible with gallstones. Gallbladder wall thickness is difficult to judge, but appears within normal limits, likely 2 mm. No definite pericholecystic fluid. Per report from the sonographer, the patient did not  exhibit a sonographic Murphy's sign on examination.  Common bile duct:  Diameter: 9.3 mm in the porta hepatis.  Liver:  No focal lesion identified. Within normal limits in parenchymal echogenicity. Probable mild intrahepatic biliary ductal dilatation.  IMPRESSION: 1. Mild intra and extrahepatic biliary ductal dilatation. 2. Cholelithiasis. No definite findings to suggest acute cholecystitis at this time. 3. However, given the presence of stones in the gallbladder, the possibility of a distal stone in the common bile duct is of concern, particularly in light of the biliary tract dilatation. Clinical correlation for signs and symptoms of biliary tract obstruction is recommended. Further evaluation with MRCP may be appropriate if clinically indicated.   Electronically Signed   By: Trudie Reed M.D.   On: 05/17/2014 14:12    Medications: . chlorhexidine  15 mL Mouth/Throat QID  . heparin  5,000 Units Subcutaneous 3 times per day  . pantoprazole (PROTONIX) IV  40 mg Intravenous Q12H    Assessment/Plan Gallstone pancreatitis  Hx of tobacco use  GERD   Plan:  Continue bowel rest, we don't have her on antibiotics AST up some today.  I will check on starting antibiotics.  She will need at least one more day of bowel rest.. Recheck labs in AM.  LOS: 2 days    Wynn Kernes 05/19/2014

## 2014-05-19 NOTE — ED Provider Notes (Signed)
Medical screening examination/treatment/procedure(s) were performed by non-physician practitioner and as supervising physician I was immediately available for consultation/collaboration.   EKG Interpretation None      Vada Swift, MD, FACEP   Jacquetta Polhamus L Haydin Calandra, MD 05/19/14 0559 

## 2014-05-20 LAB — CBC
HCT: 32.1 % — ABNORMAL LOW (ref 36.0–46.0)
Hemoglobin: 10.5 g/dL — ABNORMAL LOW (ref 12.0–15.0)
MCH: 28.4 pg (ref 26.0–34.0)
MCHC: 32.7 g/dL (ref 30.0–36.0)
MCV: 86.8 fL (ref 78.0–100.0)
PLATELETS: 228 10*3/uL (ref 150–400)
RBC: 3.7 MIL/uL — ABNORMAL LOW (ref 3.87–5.11)
RDW: 13 % (ref 11.5–15.5)
WBC: 10.2 10*3/uL (ref 4.0–10.5)

## 2014-05-20 LAB — HEPATIC FUNCTION PANEL
ALT: 45 U/L — ABNORMAL HIGH (ref 0–35)
AST: 17 U/L (ref 0–37)
Albumin: 2.4 g/dL — ABNORMAL LOW (ref 3.5–5.2)
Alkaline Phosphatase: 136 U/L — ABNORMAL HIGH (ref 39–117)
TOTAL PROTEIN: 6.1 g/dL (ref 6.0–8.3)
Total Bilirubin: 0.4 mg/dL (ref 0.3–1.2)

## 2014-05-20 LAB — LIPASE, BLOOD: Lipase: 157 U/L — ABNORMAL HIGH (ref 11–59)

## 2014-05-20 NOTE — Progress Notes (Signed)
  Subjective: She feels better but is still having some pressure symptoms.  Getting narcotics for abd pain.   Objective: Vital signs in last 24 hours: Temp:  [99 F (37.2 C)-99.1 F (37.3 C)] 99 F (37.2 C) (08/08 0510) Pulse Rate:  [84-92] 84 (08/08 0510) Resp:  [16-18] 18 (08/08 0510) BP: (97-119)/(54-62) 109/56 mmHg (08/08 0510) SpO2:  [96 %-100 %] 96 % (08/08 0510) Last BM Date: 05/16/14 Afebrile, VSS WBC better, lipase is down to 157 today, 2926 05/17/14  Intake/Output from previous day: 08/07 0701 - 08/08 0700 In: 3140 [P.O.:140; I.V.:3000] Out: 2550 [Urine:2550] Intake/Output this shift:    General appearance: alert, cooperative and no distress GI: soft tender, no peritonitis, pain in the RUQ and mid abdomen  Lab Results:   Recent Labs  05/19/14 0455 05/20/14 0513  WBC 14.9* 10.2  HGB 11.0* 10.5*  HCT 34.2* 32.1*  PLT 205 228    BMET  Recent Labs  05/18/14 0422 05/19/14 0455  NA 139 134*  K 4.0 4.4  CL 106 102  CO2 24 25  GLUCOSE 118* 115*  BUN 10 4*  CREATININE 0.74 0.65  CALCIUM 8.1* 8.0*   PT/INR No results found for this basename: LABPROT, INR,  in the last 72 hours   Recent Labs Lab 05/17/14 1204 05/17/14 1516 05/18/14 0422 05/19/14 0455 05/20/14 0513  AST 199* 171* 76* 27 17  ALT 200* 198* 117* 63* 45*  ALKPHOS 69 79 64 90 136*  BILITOT 0.9 0.7 0.6 0.5 0.4  PROT 7.0 7.6 6.2 5.6* 6.1  ALBUMIN 4.1 3.6 2.8* 2.3* 2.4*     Lipase     Component Value Date/Time   LIPASE 157* 05/20/2014 0513     Studies/Results: No results found.  Medications: . chlorhexidine  15 mL Mouth/Throat QID  . heparin  5,000 Units Subcutaneous 3 times per day  . pantoprazole (PROTONIX) IV  40 mg Intravenous Q12H    Assessment/Plan Gallstone pancreatitis  Hx of tobacco use  GERD   Plan:  Continue bowel rest Recheck labs in AM. Possible OR tom or Mon    LOS: 3 days    Almalik Weissberg C. 05/20/2014

## 2014-05-21 LAB — LIPASE, BLOOD: LIPASE: 153 U/L — AB (ref 11–59)

## 2014-05-21 MED ORDER — BISACODYL 10 MG RE SUPP
10.0000 mg | Freq: Every day | RECTAL | Status: DC | PRN
  Administered 2014-05-24: 10 mg via RECTAL
  Filled 2014-05-21: qty 1

## 2014-05-21 NOTE — Progress Notes (Signed)
  Subjective: She feels a little worse today.  She is having more nausea and mid abd pain and pressure.  Feels constipated.   Objective: Vital signs in last 24 hours: Temp:  [98.3 F (36.8 C)-99.1 F (37.3 C)] 99.1 F (37.3 C) (08/09 96040632) Pulse Rate:  [81-86] 81 (08/09 0632) Resp:  [16] 16 (08/09 0632) BP: (104-113)/(58-65) 113/61 mmHg (08/09 0632) SpO2:  [99 %-100 %] 99 % (08/09 54090632) Last BM Date: 05/16/14 Afebrile, VSS WBC better, lipase is down to 153 today, from 2926 on 05/17/14  Intake/Output from previous day: 08/08 0701 - 08/09 0700 In: 1553.3 [P.O.:180; I.V.:1373.3] Out: 3500 [Urine:3500] Intake/Output this shift:    General appearance: alert, cooperative and no distress GI: soft tender, no peritonitis, pain in the RUQ and mid abdomen  Lab Results:   Recent Labs  05/19/14 0455 05/20/14 0513  WBC 14.9* 10.2  HGB 11.0* 10.5*  HCT 34.2* 32.1*  PLT 205 228    BMET  Recent Labs  05/19/14 0455  NA 134*  K 4.4  CL 102  CO2 25  GLUCOSE 115*  BUN 4*  CREATININE 0.65  CALCIUM 8.0*   PT/INR No results found for this basename: LABPROT, INR,  in the last 72 hours   Recent Labs Lab 05/17/14 1204 05/17/14 1516 05/18/14 0422 05/19/14 0455 05/20/14 0513  AST 199* 171* 76* 27 17  ALT 200* 198* 117* 63* 45*  ALKPHOS 69 79 64 90 136*  BILITOT 0.9 0.7 0.6 0.5 0.4  PROT 7.0 7.6 6.2 5.6* 6.1  ALBUMIN 4.1 3.6 2.8* 2.3* 2.4*     Lipase     Component Value Date/Time   LIPASE 153* 05/21/2014 0456     Studies/Results: No results found.  Medications: . chlorhexidine  15 mL Mouth/Throat QID  . heparin  5,000 Units Subcutaneous 3 times per day  . pantoprazole (PROTONIX) IV  40 mg Intravenous Q12H    Assessment/Plan Gallstone pancreatitis  Hx of tobacco use  GERD   Plan:  Continue bowel rest Recheck labs in AM. Possible OR tom     LOS: 4 days    Rhaelyn Giron C. 05/21/2014

## 2014-05-22 ENCOUNTER — Inpatient Hospital Stay (HOSPITAL_COMMUNITY): Payer: BC Managed Care – PPO

## 2014-05-22 ENCOUNTER — Encounter (HOSPITAL_COMMUNITY): Payer: BC Managed Care – PPO | Admitting: Anesthesiology

## 2014-05-22 ENCOUNTER — Encounter (HOSPITAL_COMMUNITY): Admission: EM | Disposition: A | Discharge status: Home / Self Care | Payer: Self-pay | Source: Home / Self Care

## 2014-05-22 ENCOUNTER — Inpatient Hospital Stay (HOSPITAL_COMMUNITY): Payer: BC Managed Care – PPO | Admitting: Anesthesiology

## 2014-05-22 DIAGNOSIS — K8010 Calculus of gallbladder with chronic cholecystitis without obstruction: Secondary | ICD-10-CM

## 2014-05-22 DIAGNOSIS — K859 Acute pancreatitis without necrosis or infection, unspecified: Principal | ICD-10-CM

## 2014-05-22 DIAGNOSIS — K831 Obstruction of bile duct: Secondary | ICD-10-CM

## 2014-05-22 DIAGNOSIS — R932 Abnormal findings on diagnostic imaging of liver and biliary tract: Secondary | ICD-10-CM

## 2014-05-22 HISTORY — PX: CHOLECYSTECTOMY: SHX55

## 2014-05-22 LAB — CBC
HCT: 32.4 % — ABNORMAL LOW (ref 36.0–46.0)
Hemoglobin: 10.7 g/dL — ABNORMAL LOW (ref 12.0–15.0)
MCH: 28.2 pg (ref 26.0–34.0)
MCHC: 33 g/dL (ref 30.0–36.0)
MCV: 85.3 fL (ref 78.0–100.0)
Platelets: 291 10*3/uL (ref 150–400)
RBC: 3.8 MIL/uL — AB (ref 3.87–5.11)
RDW: 12.5 % (ref 11.5–15.5)
WBC: 7 10*3/uL (ref 4.0–10.5)

## 2014-05-22 LAB — HEPATIC FUNCTION PANEL
ALT: 29 U/L (ref 0–35)
AST: 14 U/L (ref 0–37)
Albumin: 2.6 g/dL — ABNORMAL LOW (ref 3.5–5.2)
Alkaline Phosphatase: 67 U/L (ref 39–117)
Bilirubin, Direct: <0.2 mg/dL (ref 0.0–0.3)
TOTAL PROTEIN: 6.6 g/dL (ref 6.0–8.3)
Total Bilirubin: 0.3 mg/dL (ref 0.3–1.2)

## 2014-05-22 LAB — LIPASE, BLOOD: Lipase: 192 U/L — ABNORMAL HIGH (ref 11–59)

## 2014-05-22 SURGERY — LAPAROSCOPIC CHOLECYSTECTOMY WITH INTRAOPERATIVE CHOLANGIOGRAM
Anesthesia: General | Site: Abdomen

## 2014-05-22 MED ORDER — MORPHINE SULFATE 2 MG/ML IJ SOLN
1.0000 mg | INTRAMUSCULAR | Status: DC | PRN
Start: 2014-05-22 — End: 2014-05-24
  Administered 2014-05-22: 2 mg via INTRAVENOUS
  Filled 2014-05-22: qty 1

## 2014-05-22 MED ORDER — EPHEDRINE SULFATE 50 MG/ML IJ SOLN
INTRAMUSCULAR | Status: AC
  Filled 2014-05-22: qty 1

## 2014-05-22 MED ORDER — MIDAZOLAM HCL 2 MG/2ML IJ SOLN
INTRAMUSCULAR | Status: AC
  Filled 2014-05-22: qty 2

## 2014-05-22 MED ORDER — ATROPINE SULFATE 0.4 MG/ML IJ SOLN
INTRAMUSCULAR | Status: AC
  Filled 2014-05-22: qty 1

## 2014-05-22 MED ORDER — HYDROMORPHONE HCL PF 1 MG/ML IJ SOLN
0.2500 mg | INTRAMUSCULAR | Status: DC | PRN
  Administered 2014-05-22 (×2): 0.5 mg via INTRAVENOUS

## 2014-05-22 MED ORDER — BUPIVACAINE HCL (PF) 0.25 % IJ SOLN
INTRAMUSCULAR | Status: DC | PRN
  Administered 2014-05-22: 30 mL

## 2014-05-22 MED ORDER — FENTANYL CITRATE 0.05 MG/ML IJ SOLN
INTRAMUSCULAR | Status: AC
  Filled 2014-05-22: qty 2

## 2014-05-22 MED ORDER — HYDROCODONE-ACETAMINOPHEN 5-325 MG PO TABS
1.0000 | ORAL_TABLET | ORAL | Status: DC | PRN
  Administered 2014-05-24 (×2): 2 via ORAL
  Filled 2014-05-22 (×2): qty 2

## 2014-05-22 MED ORDER — LACTATED RINGERS IV SOLN
INTRAVENOUS | Status: DC | PRN
  Administered 2014-05-22: 09:00:00 via INTRAVENOUS

## 2014-05-22 MED ORDER — PROMETHAZINE HCL 25 MG/ML IJ SOLN: 6.2500 mg | INTRAMUSCULAR | Status: DC | PRN

## 2014-05-22 MED ORDER — GLYCOPYRROLATE 0.2 MG/ML IJ SOLN
INTRAMUSCULAR | Status: DC | PRN
  Administered 2014-05-22: 0.6 mg via INTRAVENOUS

## 2014-05-22 MED ORDER — DEXAMETHASONE SODIUM PHOSPHATE 10 MG/ML IJ SOLN
INTRAMUSCULAR | Status: AC
  Filled 2014-05-22: qty 1

## 2014-05-22 MED ORDER — MEPERIDINE HCL 50 MG/ML IJ SOLN: 6.2500 mg | INTRAMUSCULAR | Status: DC | PRN

## 2014-05-22 MED ORDER — ROCURONIUM BROMIDE 100 MG/10ML IV SOLN
INTRAVENOUS | Status: DC | PRN
  Administered 2014-05-22: 40 mg via INTRAVENOUS
  Administered 2014-05-22: 10 mg via INTRAVENOUS

## 2014-05-22 MED ORDER — ONDANSETRON HCL 4 MG/2ML IJ SOLN
INTRAMUSCULAR | Status: DC | PRN
  Administered 2014-05-22: 4 mg via INTRAVENOUS

## 2014-05-22 MED ORDER — OXYCODONE HCL 5 MG PO TABS: 5.0000 mg | ORAL_TABLET | Freq: Once | ORAL | Status: DC | PRN

## 2014-05-22 MED ORDER — PANTOPRAZOLE SODIUM 40 MG IV SOLR
40.0000 mg | Freq: Two times a day (BID) | INTRAVENOUS | Status: DC
  Administered 2014-05-22 – 2014-05-24 (×4): 40 mg via INTRAVENOUS
  Filled 2014-05-22 (×6): qty 40

## 2014-05-22 MED ORDER — PROPOFOL 10 MG/ML IV BOLUS
INTRAVENOUS | Status: DC | PRN
  Administered 2014-05-22: 150 mg via INTRAVENOUS

## 2014-05-22 MED ORDER — OXYCODONE HCL 5 MG/5ML PO SOLN
5.0000 mg | Freq: Once | ORAL | Status: DC | PRN
  Filled 2014-05-22: qty 5

## 2014-05-22 MED ORDER — HYDROMORPHONE HCL PF 1 MG/ML IJ SOLN
INTRAMUSCULAR | Status: AC
  Administered 2014-05-23: 1 mg via INTRAVENOUS
  Filled 2014-05-22: qty 1

## 2014-05-22 MED ORDER — ROCURONIUM BROMIDE 100 MG/10ML IV SOLN
INTRAVENOUS | Status: AC
  Filled 2014-05-22: qty 1

## 2014-05-22 MED ORDER — NEOSTIGMINE METHYLSULFATE 10 MG/10ML IV SOLN
INTRAVENOUS | Status: AC
  Filled 2014-05-22: qty 1

## 2014-05-22 MED ORDER — SODIUM CHLORIDE 0.9 % IJ SOLN
INTRAMUSCULAR | Status: AC
  Filled 2014-05-22: qty 10

## 2014-05-22 MED ORDER — ONDANSETRON HCL 4 MG/2ML IJ SOLN
INTRAMUSCULAR | Status: AC
  Filled 2014-05-22: qty 2

## 2014-05-22 MED ORDER — LIDOCAINE HCL (CARDIAC) 20 MG/ML IV SOLN
INTRAVENOUS | Status: AC
  Filled 2014-05-22: qty 5

## 2014-05-22 MED ORDER — LACTATED RINGERS IV SOLN
INTRAVENOUS | Status: DC | PRN
  Administered 2014-05-22: 1000 mL via INTRAVENOUS

## 2014-05-22 MED ORDER — LACTATED RINGERS IV SOLN: INTRAVENOUS | Status: DC

## 2014-05-22 MED ORDER — NEOSTIGMINE METHYLSULFATE 10 MG/10ML IV SOLN
INTRAVENOUS | Status: DC | PRN
  Administered 2014-05-22: 4 mg via INTRAVENOUS

## 2014-05-22 MED ORDER — GLYCOPYRROLATE 0.2 MG/ML IJ SOLN
INTRAMUSCULAR | Status: AC
  Filled 2014-05-22: qty 3

## 2014-05-22 MED ORDER — DEXAMETHASONE SODIUM PHOSPHATE 10 MG/ML IJ SOLN
INTRAMUSCULAR | Status: DC | PRN
  Administered 2014-05-22: 10 mg via INTRAVENOUS

## 2014-05-22 MED ORDER — MIDAZOLAM HCL 5 MG/5ML IJ SOLN
INTRAMUSCULAR | Status: DC | PRN
  Administered 2014-05-22: 2 mg via INTRAVENOUS

## 2014-05-22 MED ORDER — IOHEXOL 350 MG/ML SOLN
INTRAVENOUS | Status: DC | PRN
  Administered 2014-05-22: 16 mL via INTRAVENOUS

## 2014-05-22 MED ORDER — PROPOFOL 10 MG/ML IV BOLUS
INTRAVENOUS | Status: AC
  Filled 2014-05-22: qty 20

## 2014-05-22 MED ORDER — BUPIVACAINE HCL (PF) 0.25 % IJ SOLN
INTRAMUSCULAR | Status: AC
  Filled 2014-05-22: qty 30

## 2014-05-22 MED ORDER — FENTANYL CITRATE 0.05 MG/ML IJ SOLN
INTRAMUSCULAR | Status: AC
  Filled 2014-05-22: qty 5

## 2014-05-22 MED ORDER — FENTANYL CITRATE 0.05 MG/ML IJ SOLN
INTRAMUSCULAR | Status: DC | PRN
  Administered 2014-05-22: 100 ug via INTRAVENOUS
  Administered 2014-05-22 (×3): 50 ug via INTRAVENOUS
  Administered 2014-05-22: 100 ug via INTRAVENOUS

## 2014-05-22 MED ORDER — LIDOCAINE HCL (PF) 2 % IJ SOLN
INTRAMUSCULAR | Status: DC | PRN
Start: 2014-05-22 — End: 2014-05-22
  Administered 2014-05-22: 75 mg via INTRADERMAL

## 2014-05-22 SURGICAL SUPPLY — 45 items
APPLIER CLIP ROT 10 11.4 M/L (STAPLE) ×2
BENZOIN TINCTURE PRP APPL 2/3 (GAUZE/BANDAGES/DRESSINGS) ×2 IMPLANT
CANISTER SUCTION 2500CC (MISCELLANEOUS) ×2 IMPLANT
CHLORAPREP W/TINT 26ML (MISCELLANEOUS) ×2 IMPLANT
CHOLANGIOGRAM CATH TAUT (CATHETERS) ×2 IMPLANT
CLIP APPLIE ROT 10 11.4 M/L (STAPLE) ×1 IMPLANT
COVER MAYO STAND STRL (DRAPES) ×2 IMPLANT
DECANTER SPIKE VIAL GLASS SM (MISCELLANEOUS) ×2 IMPLANT
DERMABOND ADVANCED (GAUZE/BANDAGES/DRESSINGS) ×1
DERMABOND ADVANCED .7 DNX12 (GAUZE/BANDAGES/DRESSINGS) ×1 IMPLANT
DRAPE C-ARM 42X120 X-RAY (DRAPES) ×2 IMPLANT
DRAPE LAPAROSCOPIC ABDOMINAL (DRAPES) ×2 IMPLANT
DRAPE UTILITY XL STRL (DRAPES) ×2 IMPLANT
ELECT REM PT RETURN 9FT ADLT (ELECTROSURGICAL) ×2
ELECTRODE REM PT RTRN 9FT ADLT (ELECTROSURGICAL) ×1 IMPLANT
ENDOLOOP SUT PDS II  0 18 (SUTURE) ×3
ENDOLOOP SUT PDS II 0 18 (SUTURE) ×3 IMPLANT
GLOVE BIOGEL M 8.0 STRL (GLOVE) ×2 IMPLANT
GLOVE BIOGEL PI IND STRL 6.5 (GLOVE) ×3 IMPLANT
GLOVE BIOGEL PI INDICATOR 6.5 (GLOVE) ×3
GLOVE ECLIPSE 6.5 STRL STRAW (GLOVE) ×2 IMPLANT
GLOVE SURG SIGNA 7.5 PF LTX (GLOVE) ×2 IMPLANT
GLOVE SURG SS PI 7.0 STRL IVOR (GLOVE) ×2 IMPLANT
GOWN SPEC L4 XLG W/TWL (GOWN DISPOSABLE) ×2 IMPLANT
GOWN STRL REUS W/ TWL XL LVL3 (GOWN DISPOSABLE) ×3 IMPLANT
GOWN STRL REUS W/TWL XL LVL3 (GOWN DISPOSABLE) ×3
HEMOSTAT SURGICEL 4X8 (HEMOSTASIS) IMPLANT
IV CATH 14GX2 1/4 (CATHETERS) ×2 IMPLANT
IV SET MACRO CATH EXT 6 LUER (IV SETS) ×2 IMPLANT
KIT BASIN OR (CUSTOM PROCEDURE TRAY) ×2 IMPLANT
NS IRRIG 1000ML POUR BTL (IV SOLUTION) ×2 IMPLANT
POUCH SPECIMEN RETRIEVAL 10MM (ENDOMECHANICALS) IMPLANT
SET IRRIG TUBING LAPAROSCOPIC (IRRIGATION / IRRIGATOR) ×2 IMPLANT
SLEEVE XCEL OPT CAN 5 100 (ENDOMECHANICALS) ×4 IMPLANT
SOLUTION ANTI FOG 6CC (MISCELLANEOUS) ×2 IMPLANT
STOPCOCK 4 WAY LG BORE MALE ST (IV SETS) ×2 IMPLANT
STRIP CLOSURE SKIN 1/4X4 (GAUZE/BANDAGES/DRESSINGS) ×2 IMPLANT
SUT VIC AB 5-0 PS2 18 (SUTURE) ×2 IMPLANT
TOWEL OR 17X26 10 PK STRL BLUE (TOWEL DISPOSABLE) ×2 IMPLANT
TOWEL OR NON WOVEN STRL DISP B (DISPOSABLE) ×2 IMPLANT
TRAY LAP CHOLE (CUSTOM PROCEDURE TRAY) ×2 IMPLANT
TROCAR BLADELESS OPT 5 100 (ENDOMECHANICALS) ×4 IMPLANT
TROCAR XCEL BLUNT TIP 100MML (ENDOMECHANICALS) ×2 IMPLANT
TROCAR XCEL NON-BLD 11X100MML (ENDOMECHANICALS) IMPLANT
TUBING INSUFFLATION 10FT LAP (TUBING) ×2 IMPLANT

## 2014-05-22 NOTE — Progress Notes (Signed)
General Surgery Note  LOS: 5 days  POD -     Assessment/Plan: 1.  Pancreatitis  Amylase up mildly to 192 - 05/22/2014, LFTs okay  She is clinically better and ready for surgery  2.  Gallstones  I discussed with the patient the indications and risks of gall bladder surgery.  The primary risks of gall bladder surgery include, but are not limited to, bleeding, infection, common bile duct injury, and open surgery.  There is also the risk that the patient may have continued symptoms after surgery.  We talked about possible CBD stones and their management.  We discussed the typical post-operative recovery course. I tried to answer the patient's questions.  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  Normal out of work would be 1 to 2 weeks.   3.  DVT prophylaxis - SQ heparin   Active Problems:   Pancreatitis   Gallstones  Subjective:  Doing better.  Has not had a BM.  Not really hungry. Objective:   Filed Vitals:   05/22/14 0540  BP: 102/55  Pulse: 69  Temp: 97.8 F (36.6 C)  Resp: 18     Intake/Output from previous day:  08/09 0701 - 08/10 0700 In: 1925 [P.O.:180; I.V.:1745] Out: 2450 [Urine:2450]  Intake/Output this shift:      Physical Exam:   General: WN WF who is alert and oriented.    HEENT: Normal. Pupils equal. .   Lungs: Clear   Abdomen: Soft.  BS present.     Lab Results:    Recent Labs  05/20/14 0513 05/22/14 0450  WBC 10.2 7.0  HGB 10.5* 10.7*  HCT 32.1* 32.4*  PLT 228 291    BMET  No results found for this basename: NA, K, CL, CO2, GLUCOSE, BUN, CREATININE, CALCIUM,  in the last 72 hours  PT/INR  No results found for this basename: LABPROT, INR,  in the last 72 hours  ABG  No results found for this basename: PHART, PCO2, PO2, HCO3,  in the last 72 hours   Studies/Results:  No results found.   Anti-infectives:   Anti-infectives   Start     Dose/Rate Route Frequency Ordered Stop   05/17/14 1630  Ampicillin-Sulbactam (UNASYN) 3 g in sodium  chloride 0.9 % 100 mL IVPB     3 g 100 mL/hr over 60 Minutes Intravenous  Once 05/17/14 1622 05/17/14 1927      Ovidio Kinavid Mariya Mottley, MD, FACS Pager: [phone] [facility] Office: [phone] 05/22/2014

## 2014-05-22 NOTE — Op Note (Signed)
05/17/2014 - 05/22/2014  10:35 AM  PATIENT:  Sandy Gilbert, 43 y.o., female, MRN: [id number]  PREOP DIAGNOSIS:  cholelithiasis  POSTOP DIAGNOSIS:   Chronic cholecystitis, cholelithiasis, choledocholithiasis  PROCEDURE:   Procedure(s):  LAPAROSCOPIC CHOLECYSTECTOMY WITH INTRAOPERATIVE CHOLANGIOGRAM  SURGEON:   Ovidio Kinavid Jestina Stephani, M.D.  Threasa HeadsASSISTANSheron Nightingale:   M. Martin, M.D.  ANESTHESIA:   general  Anesthesiologist: Gaylan GeroldJohn R Germeroth, MD CRNA: Elyn PeersSandra J Allen, CRNA  General  ASA: 2  EBL:  minimal  ml  BLOOD ADMINISTERED: none  DRAINS: none   LOCAL MEDICATIONS USED:   30 cc 1/4% Marcaine  SPECIMEN:   Gall bladder  COUNTS CORRECT:  YES  INDICATIONS FOR PROCEDURE:  Sandy Gilbert is a 43 y.o. (DOB: 12-Jun-1971) white  female whose primary care physician is SMITH,KRISTI, MD and comes for cholecystectomy.   The indications and risks of the gall bladder surgery were explained to the patient.  The risks include, but are not limited to, infection, bleeding, common bile duct injury and open surgery.  SURGERY:  [facility]  The abdomen was prepped with chloroprep.  The patient was not given antibiotics.   A time out was held and the surgical checklist run.   An infraumbilical incision was made into the abdominal cavity.  A 12 mm Hasson trocar was inserted into the abdominal cavity through the infraumbilical incision and secured with a 0 Vicryl suture.  Three additional trocars were inserted: a 5 mm trocar in the sub-xiphoid location, a 5 mm trocar in the right mid subcostal area, and a 5 mm trocar in the right lateral subcostal area.   The abdomen was explored and the liver, stomach, and bowel that could be seen were unremarkable.  She did have saponification of omental and mesenteric fat and saponification of the peritoneal surface.   The gall bladder had evidence of chronic inflammation and a thickened gall bladder wall.  The gall bladder was  identified, grasped, and rotated cephalad.  Disssection was carried down to the gall bladder/cystic duct junction and the cystic duct isolated.  A clip was placed on the gall bladder side of the cystic duct.   An intra-operative cholangiogram was shot.   The intra-operative cholangiogram was shot using a cut off Taut catheter placed through a 14 gauge angiocath in the RUQ.  The Taut catheter was inserted in the cut cystic duct and secured with an endoclip.  A cholangiogram was shot with 15 cc of 1/2 strength Omnipaque.  Using fluoroscopy, the cholangiogram showed the flow of contrast into the common bile duct, up the hepatic radicals, and into the duodenum.  There were multiple filling defects consistent with common duct stones.  Because of the inflammation from the pancreatitis, I did not try to do any common bile duct exploration.   The Taut catheter was removed.  The cystic duct was endolooped with a PDS suture.  The cystic artery was identified and clipped x 3.  The gall bladder was bluntly and sharpley dissected from the gall bladder bed.   After the gall bladder was removed from the liver, the gall bladder bed and Tri[place] of [place] were inspected.  There was no bleeding or bile leak.  The gall bladder was placed in a endocatch bag and delivered through the umbilicus.  The abdomen was irrigated with 1,000 cc saline.   The trocars were then removed.  I infiltrated 30 cc of 1/4% Marcaine into the incisions.  The umbilical port  closed with a 0 Vicryl suture and the skin closed with 5-0 vicryl.  The skin was painted with Dermabond.  The patient's sponge and needle count were correct.  The patient was transported to the RR in good condition.   I will consult gastroenterology about her common bile duct stones.  Ovidio Kin, MD, [facility] Surgery Pager: [phone] Office phone:  [phone]

## 2014-05-22 NOTE — Transfer of Care (Signed)
Immediate Anesthesia Transfer of Care Note  Patient: Sandy GrahamAllison G Gilbert  Procedure(s) Performed: Procedure(s): LAPAROSCOPIC CHOLECYSTECTOMY WITH INTRAOPERATIVE CHOLANGIOGRAM (N/A)  Patient Location: PACU  Anesthesia Type:General  Level of Consciousness: awake, sedated and responds to stimulation  Airway & Oxygen Therapy: Patient Spontanous Breathing and Patient connected to face mask oxygen  Post-op Assessment: Report given to PACU RN and Post -op Vital signs reviewed and stable  Post vital signs: Reviewed and stable  Complications: No apparent anesthesia complications

## 2014-05-22 NOTE — Anesthesia Preprocedure Evaluation (Signed)
Anesthesia Evaluation  Patient identified by MRN, date of birth, ID band Patient awake    Reviewed: Allergy & Precautions, H&P , NPO status , Patient's Chart, lab work & pertinent test results  Airway Mallampati: II TM Distance: >3 FB Neck ROM: Full    Dental no notable dental hx.    Pulmonary neg pulmonary ROS, former smoker,  breath sounds clear to auscultation  Pulmonary exam normal       Cardiovascular negative cardio ROS  Rhythm:Regular Rate:Normal     Neuro/Psych negative neurological ROS  negative psych ROS   GI/Hepatic Neg liver ROS, GERD-  Medicated,  Endo/Other  negative endocrine ROS  Renal/GU negative Renal ROS     Musculoskeletal negative musculoskeletal ROS (+)   Abdominal   Peds  Hematology negative hematology ROS (+)   Anesthesia Other Findings   Reproductive/Obstetrics negative OB ROS                           Anesthesia Physical Anesthesia Plan  ASA: II  Anesthesia Plan: General   Post-op Pain Management:    Induction: Intravenous and Rapid sequence  Airway Management Planned: Oral ETT  Additional Equipment:   Intra-op Plan:   Post-operative Plan: Extubation in OR  Informed Consent: I have reviewed the patients History and Physical, chart, labs and discussed the procedure including the risks, benefits and alternatives for the proposed anesthesia with the patient or authorized representative who has indicated his/her understanding and acceptance.   Dental advisory given  Plan Discussed with: CRNA  Anesthesia Plan Comments:         Anesthesia Quick Evaluation

## 2014-05-22 NOTE — Anesthesia Postprocedure Evaluation (Signed)
Anesthesia Post Note  Patient: Sandy Gilbert  Procedure(s) Performed: Procedure(s) (LRB): LAPAROSCOPIC CHOLECYSTECTOMY WITH INTRAOPERATIVE CHOLANGIOGRAM (N/A)  Anesthesia type: General  Patient location: PACU  Post pain: Pain level controlled  Post assessment: Post-op Vital signs reviewed  Last Vitals: BP 115/72  Pulse 67  Temp(Src) 36.6 C (Oral)  Resp 18  Ht 5\' 5"  (1.651 m)  Wt 173 lb 8 oz (78.7 kg)  BMI 28.87 kg/m2  SpO2 98%  LMP 04/16/2014  Post vital signs: Reviewed  Level of consciousness: sedated  Complications: No apparent anesthesia complications

## 2014-05-22 NOTE — Consult Note (Signed)
Referring Provider: Dr. Ezzard Standing Primary Care Physician:  Nilda Simmer, MD Primary Gastroenterologist:  none  Reason for Consultation:  CBD stones  HPI: Sandy Gilbert is a 43 y.o. female in good health who was admitted on 05/17/2014 with acute abdominal pain nausea and vomiting and workup consistent with acute gallstone pancreatitis. Should undergo an upper abdominal ultrasound on 05/17/2014 which showed mild intra-and extrahepatic ductal dilation and a gallbladder filled with stones and contracted. The common bile duct was 9.3 mm but no definite stones were seen in the common bile duct. She then had MRCP on 05/18/2014 showed common bile duct of 14 mm proximally tapering to 7 mm distally, no common bile duct stones were seen there was  evidence of mild pancreatitis. She has been recuperating over the past few days and underwent laparoscopic cholecystectomy today. On IOC she was found to have several filling defects consistent with common bile duct stones. She is sore postoperatively but other wise feeling better. On admission her lipase was 2926, today preoperatively lipase 192. LFTs on admission showed an AST of 171 ALT of 198, LFTs today preoperatively normal.   Past Medical History  Diagnosis Date  . GERD (gastroesophageal reflux disease)     History reviewed. No pertinent past surgical history.  Prior to Admission medications   Medication Sig Start Date End Date Taking? Authorizing Provider  acetaminophen (TYLENOL) 325 MG tablet Take 650 mg by mouth every 6 (six) hours as needed (pain.).    Yes Historical Provider, MD  etonogestrel-ethinyl estradiol (NUVARING) 0.12-0.015 MG/24HR vaginal ring Place 1 each vaginally every 28 (twenty-eight) days. Insert vaginally and leave in place for 3 consecutive weeks, then remove for 1 week.   Yes Historical Provider, MD  ranitidine (ZANTAC) 150 MG capsule Take 150 mg by mouth 2 (two) times daily.   Yes Historical Provider, MD  sucralfate  (CARAFATE) 1 G tablet 1 g 2 (two) times daily. Take one 1 hr ac and hs 12/19/13  Yes Phillips Odor, MD    Current Facility-Administered Medications  Medication Dose Route Frequency Provider Last Rate Last Dose  . bisacodyl (DULCOLAX) suppository 10 mg  10 mg Rectal Daily PRN Romie Levee, MD      . chlorhexidine (PERIDEX) 0.12 % solution 15 mL  15 mL Mouth/Throat QID Valarie Merino, MD   15 mL at 05/21/14 2122  . dextrose 5 % and 0.45 % NaCl with KCl 20 mEq/L infusion   Intravenous Continuous Kandis Cocking, MD      . diphenhydrAMINE (BENADRYL) injection 12.5-25 mg  12.5-25 mg Intravenous Q6H PRN Valarie Merino, MD       Or  . diphenhydrAMINE (BENADRYL) 12.5 MG/5ML elixir 12.5-25 mg  12.5-25 mg Oral Q6H PRN Valarie Merino, MD      . heparin injection 5,000 Units  5,000 Units Subcutaneous 3 times per day Sammuel Cooper, PA-C   5,000 Units at 05/22/14 4098  . HYDROcodone-acetaminophen (NORCO/VICODIN) 5-325 MG per tablet 1-2 tablet  1-2 tablet Oral Q4H PRN Kandis Cocking, MD      . HYDROmorphone (DILAUDID) 1 MG/ML injection           . HYDROmorphone (DILAUDID) injection 0.5-1 mg  0.5-1 mg Intravenous Q2H PRN Sherrie George, PA-C   1 mg at 05/22/14 0249  . morphine 2 MG/ML injection 1-3 mg  1-3 mg Intravenous Q2H PRN Kandis Cocking, MD      . ondansetron [facility]) injection 4 mg  4 mg Intravenous Q6H PRN Molli Hazard  Hortencia ConradiB Martin, MD   4 mg at 05/21/14 0834  . pantoprazole (PROTONIX) injection 40 mg  40 mg Intravenous Q12H Sherrie GeorgeWillard Jennings, PA-C   40 mg at 05/21/14 2123    Allergies as of 05/17/2014 - Review Complete 05/17/2014  Allergen Reaction Noted  . Codeine  12/24/2013    No family history on file.  History   Social History  . Marital Status: Married    Spouse Name: N/A    Number of Children: N/A  . Years of Education: N/A   Occupational History  . Not on file.   Social History Main Topics  . Smoking status: Former Smoker    Quit date: 03/04/2000  . Smokeless tobacco:  Never Used  . Alcohol Use: Yes     Comment: very rare  . Drug Use: No  . Sexual Activity: Not on file   Other Topics Concern  . Not on file   Social History Narrative  . No narrative on file    Review of Systems: Pertinent positive and negative review of systems were noted in the above HPI section.  All other review of systems was otherwise negative.  Physical Exam: Vital signs in last 24 hours: Temp:  [97.8 F (36.6 C)-98.7 F (37.1 C)] 97.9 F (36.6 C) (08/10 1143) Pulse Rate:  [68-98] 70 (08/10 1143) Resp:  [12-18] 16 (08/10 1143) BP: (100-121)/(52-71) 113/63 mmHg (08/10 1143) SpO2:  [98 %-100 %] 100 % (08/10 1143) Last BM Date: 05/16/14 General:   Alert,  Well-developed, well-nourished,WF , pleasant and cooperative in NAD Head:  Normocephalic and atraumatic. Eyes:  Sclera clear, no icterus.   Conjunctiva pink. Ears:  Normal auditory acuity. Nose:  No deformity, discharge,  or lesions. Mouth:  No deformity or lesions.   Neck:  Supple; no masses or thyromegaly. Lungs:  Clear throughout to auscultation.   No wheezes, crackles, or rhonchi. Heart:  Regular rate and rhythm; no murmurs, clicks, rubs,  or gallops. Abdomen:  Soft,mildly tender diffusely , incisions benign BS active,nonpalp mass or hsm.   Rectal:  Deferred  Msk:  Symmetrical without gross deformities. . Pulses:  Normal pulses noted. Extremities:  Without clubbing or edema. Neurologic:  Alert and  oriented x4;  grossly normal neurologically. Skin:  Intact without significant lesions or rashes.. Psych:  Alert and cooperative. Normal mood and affect.  Intake/Output from previous day: 08/09 0701 - 08/10 0700 In: 1925 [P.O.:180; I.V.:1745] Out: 2450 [Urine:2450] Intake/Output this shift: Total I/O In: 1116.3 [I.V.:1116.3] Out: 100 [Urine:100]  Lab Results:  Recent Labs  05/20/14 0513 05/22/14 0450  WBC 10.2 7.0  HGB 10.5* 10.7*  HCT 32.1* 32.4*  PLT 228 291   BMET No results found for this  basename: NA, K, CL, CO2, GLUCOSE, BUN, CREATININE, CALCIUM,  in the last 72 hours LFT  Recent Labs  05/22/14 0450  PROT 6.6  ALBUMIN 2.6*  AST 14  ALT 29  ALKPHOS 67  BILITOT 0.3  BILIDIR <0.2  IBILI NOT CALCULATED   PT/INR No results found for this basename: LABPROT, INR,  in the last 72 hours Hepatitis Panel No results found for this basename: HEPBSAG, HCVAB, HEPAIGM, HEPBIGM,  in the last 72 hours    IMPRESSION:  #651  43 year old female with acute gallstone pancreatitis onset 05/17/2014, now status post laparoscopic cholecystectomy today with finding of common bile duct stones on IOC. #2 mild normocytic anemia  PLAN: #1 ERCP and stone extraction scheduled with Dr. Christella HartiganJacobs at 4;30 pm tomorrow afternoon. Procedure was discussed  in detail with the patient today including 5-6% risk of pancreatitis. She is agreeable to proceed. #2 continue clear liquids through breakfast tomorrow morning then n.p.o. #3 Unasyn pre-procedure, stop heparin in a.m.    Amy Esterwood  05/22/2014, 12:12 PM Attending MD note:   I have taken a history, examined the patient, and reviewed the chart and IOC which shows at least 3  Stones floating in the distal CBD which is mildly dilated.. I agree with the Advanced Practitioner's impression and recommendations to undergo ERCP/sphincterotomy and stone extraction. I have mentioned possibility of a biliary stent in case  stones could not be removed at one time. Pre- procedure antibiotics ordered.   Willa Rough [facility] Pager # [phone]

## 2014-05-23 ENCOUNTER — Encounter (HOSPITAL_COMMUNITY): Admission: EM | Disposition: A | Discharge status: Home / Self Care | Payer: Self-pay | Source: Home / Self Care

## 2014-05-23 ENCOUNTER — Inpatient Hospital Stay (HOSPITAL_COMMUNITY): Payer: BC Managed Care – PPO | Admitting: Anesthesiology

## 2014-05-23 ENCOUNTER — Inpatient Hospital Stay (HOSPITAL_COMMUNITY): Payer: BC Managed Care – PPO

## 2014-05-23 ENCOUNTER — Encounter (HOSPITAL_COMMUNITY): Payer: Self-pay | Admitting: Surgery

## 2014-05-23 ENCOUNTER — Encounter (HOSPITAL_COMMUNITY): Payer: BC Managed Care – PPO | Admitting: Anesthesiology

## 2014-05-23 HISTORY — PX: ERCP: SHX5425

## 2014-05-23 LAB — COMPREHENSIVE METABOLIC PANEL
ALT: 88 U/L — AB (ref 0–35)
AST: 77 U/L — ABNORMAL HIGH (ref 0–37)
Albumin: 2.6 g/dL — ABNORMAL LOW (ref 3.5–5.2)
Alkaline Phosphatase: 165 U/L — ABNORMAL HIGH (ref 39–117)
Anion gap: 10 (ref 5–15)
BUN: 6 mg/dL (ref 6–23)
CO2: 28 mEq/L (ref 19–32)
Calcium: 9 mg/dL (ref 8.4–10.5)
Chloride: 102 mEq/L (ref 96–112)
Creatinine, Ser: 0.69 mg/dL (ref 0.50–1.10)
GFR calc non Af Amer: >90 mL/min (ref >90)
Glucose, Bld: 126 mg/dL — ABNORMAL HIGH (ref 70–99)
Potassium: 4.4 mEq/L (ref 3.7–5.3)
Sodium: 140 mEq/L (ref 137–147)
TOTAL PROTEIN: 6.7 g/dL (ref 6.0–8.3)
Total Bilirubin: 0.2 mg/dL — ABNORMAL LOW (ref 0.3–1.2)

## 2014-05-23 SURGERY — ERCP, WITH INTERVENTION IF INDICATED
Anesthesia: Monitor Anesthesia Care

## 2014-05-23 SURGERY — ERCP, WITH INTERVENTION IF INDICATED
Anesthesia: General

## 2014-05-23 MED ORDER — ONDANSETRON HCL 4 MG/2ML IJ SOLN
INTRAMUSCULAR | Status: DC | PRN
  Administered 2014-05-23: 4 mg via INTRAVENOUS

## 2014-05-23 MED ORDER — PROPOFOL 10 MG/ML IV BOLUS
INTRAVENOUS | Status: AC
  Filled 2014-05-23: qty 20

## 2014-05-23 MED ORDER — LIDOCAINE HCL (CARDIAC) 20 MG/ML IV SOLN
INTRAVENOUS | Status: DC | PRN
  Administered 2014-05-23: 100 mg via INTRAVENOUS

## 2014-05-23 MED ORDER — DEXAMETHASONE SODIUM PHOSPHATE 10 MG/ML IJ SOLN
INTRAMUSCULAR | Status: DC | PRN
  Administered 2014-05-23: 10 mg via INTRAVENOUS

## 2014-05-23 MED ORDER — PROMETHAZINE HCL 25 MG/ML IJ SOLN: 6.2500 mg | INTRAMUSCULAR | Status: DC | PRN

## 2014-05-23 MED ORDER — MIDAZOLAM HCL 2 MG/2ML IJ SOLN
INTRAMUSCULAR | Status: AC
  Filled 2014-05-23: qty 2

## 2014-05-23 MED ORDER — HYDROMORPHONE HCL PF 1 MG/ML IJ SOLN
0.2500 mg | INTRAMUSCULAR | Status: DC | PRN
  Administered 2014-05-23 (×2): 0.5 mg via INTRAVENOUS

## 2014-05-23 MED ORDER — LIDOCAINE HCL (CARDIAC) 20 MG/ML IV SOLN
INTRAVENOUS | Status: AC
  Filled 2014-05-23: qty 5

## 2014-05-23 MED ORDER — FENTANYL CITRATE 0.05 MG/ML IJ SOLN
INTRAMUSCULAR | Status: AC
  Filled 2014-05-23: qty 2

## 2014-05-23 MED ORDER — HYDROMORPHONE HCL PF 1 MG/ML IJ SOLN
INTRAMUSCULAR | Status: AC
  Filled 2014-05-23: qty 1

## 2014-05-23 MED ORDER — CIPROFLOXACIN IN D5W 400 MG/200ML IV SOLN
400.0000 mg | Freq: Two times a day (BID) | INTRAVENOUS | Status: DC
  Administered 2014-05-23: 400 mg via INTRAVENOUS
  Filled 2014-05-23: qty 200

## 2014-05-23 MED ORDER — SUCCINYLCHOLINE CHLORIDE 20 MG/ML IJ SOLN
INTRAMUSCULAR | Status: DC | PRN
  Administered 2014-05-23: 100 mg via INTRAVENOUS

## 2014-05-23 MED ORDER — INDOMETHACIN 50 MG RE SUPP
100.0000 mg | Freq: Once | RECTAL | Status: AC
  Administered 2014-05-23: 100 mg via RECTAL
  Filled 2014-05-23: qty 2

## 2014-05-23 MED ORDER — SODIUM CHLORIDE 0.9 % IV SOLN
1.5000 g | Freq: Once | INTRAVENOUS | Status: AC
  Administered 2014-05-23: 1.5 g via INTRAVENOUS
  Filled 2014-05-23: qty 1.5

## 2014-05-23 MED ORDER — CIPROFLOXACIN IN D5W 400 MG/200ML IV SOLN
INTRAVENOUS | Status: AC
  Filled 2014-05-23: qty 200

## 2014-05-23 MED ORDER — SODIUM CHLORIDE 0.9 % IV SOLN
INTRAVENOUS | Status: DC | PRN
  Administered 2014-05-23: 17:00:00

## 2014-05-23 MED ORDER — MIDAZOLAM HCL 5 MG/5ML IJ SOLN
INTRAMUSCULAR | Status: DC | PRN
  Administered 2014-05-23: 2 mg via INTRAVENOUS

## 2014-05-23 MED ORDER — PROPOFOL 10 MG/ML IV BOLUS
INTRAVENOUS | Status: DC | PRN
  Administered 2014-05-23: 140 mg via INTRAVENOUS

## 2014-05-23 MED ORDER — LACTATED RINGERS IV SOLN
INTRAVENOUS | Status: DC | PRN
  Administered 2014-05-23: 16:00:00 via INTRAVENOUS

## 2014-05-23 NOTE — Progress Notes (Signed)
1 Day Post-Op  Subjective: She feels better and is on clears for now.  ERCP at 4:30 PM later today.  Objective: Vital signs in last 24 hours: Temp:  [97.7 F (36.5 C)-98.6 F (37 C)] 97.7 F (36.5 C) (08/11 0537) Pulse Rate:  [58-81] 58 (08/11 0537) Resp:  [12-18] 16 (08/11 0537) BP: (100-120)/(52-76) 102/67 mmHg (08/11 0537) SpO2:  [96 %-100 %] 100 % (08/11 0537) Last BM Date: 05/16/14 Afebrile, VSS Mild LFT elevation IOC shows several common duct stones,CBD dilitation Intake/Output from previous day: 08/10 0701 - 08/11 0700 In: 2753.8 [I.V.:2753.8] Out: 3500 [Urine:3500] Intake/Output this shift:    General appearance: alert, cooperative and no distress GI: soft taking clears, sites look fine.  Still sore.  Lab Results:   Recent Labs  05/22/14 0450  WBC 7.0  HGB 10.7*  HCT 32.4*  PLT 291    BMET  Recent Labs  05/23/14 0503  NA 140  K 4.4  CL 102  CO2 28  GLUCOSE 126*  BUN 6  CREATININE 0.69  CALCIUM 9.0   PT/INR No results found for this basename: LABPROT, INR,  in the last 72 hours   Recent Labs Lab 05/18/14 0422 05/19/14 0455 05/20/14 0513 05/22/14 0450 05/23/14 0503  AST 76* 27 17 14  77*  ALT 117* 63* 45* 29 88*  ALKPHOS 64 90 136* 67 165*  BILITOT 0.6 0.5 0.4 0.3 0.2*  PROT 6.2 5.6* 6.1 6.6 6.7  ALBUMIN 2.8* 2.3* 2.4* 2.6* 2.6*     Lipase     Component Value Date/Time   LIPASE 192* 05/22/2014 0450     Studies/Results: Dg Cholangiogram Operative  05/22/2014   CLINICAL DATA:  Gallstones  EXAM: INTRAOPERATIVE CHOLANGIOGRAM  TECHNIQUE: Cholangiographic images from the C-arm fluoroscopic device were submitted for interpretation post-operatively. Please see the procedural report for the amount of contrast and the fluoroscopy time utilized.  COMPARISON:  None.  FINDINGS: Contrast fills the biliary tree and duodenum. The common bile duct is dilated. Several common duct stones are present.  IMPRESSION: Patent biliary tree.  Several common  duct stones are present.   Electronically Signed   By: Maryclare BeanArt  Hoss M.D.   On: 05/22/2014 12:04    Medications: . chlorhexidine  15 mL Mouth/Throat QID  . pantoprazole (PROTONIX) IV  40 mg Intravenous Q12H    Assessment/Plan Gallstone pancreatitis Choledocholithiasis GERD Hx of tobacco use   Plan:  ERCP later today. Recheck labs in AM    LOS: 6 days    JENNINGS,WILLARD 05/23/2014  Agree with above.  Ovidio Kinavid Jorge Retz, MD, [facility] Pager: [phone] Office phone:  [phone]

## 2014-05-23 NOTE — H&P (View-Only) (Signed)
Referring Provider: Dr. Ezzard Standing Primary Care Physician:  Nilda Simmer, MD Primary Gastroenterologist:  none  Reason for Consultation:  CBD stones  HPI: Sandy Gilbert is a 43 y.o. female in good health who was admitted on 05/17/2014 with acute abdominal pain nausea and vomiting and workup consistent with acute gallstone pancreatitis. Should undergo an upper abdominal ultrasound on 05/17/2014 which showed mild intra-and extrahepatic ductal dilation and a gallbladder filled with stones and contracted. The common bile duct was 9.3 mm but no definite stones were seen in the common bile duct. She then had MRCP on 05/18/2014 showed common bile duct of 14 mm proximally tapering to 7 mm distally, no common bile duct stones were seen there was  evidence of mild pancreatitis. She has been recuperating over the past few days and underwent laparoscopic cholecystectomy today. On IOC she was found to have several filling defects consistent with common bile duct stones. She is sore postoperatively but other wise feeling better. On admission her lipase was 2926, today preoperatively lipase 192. LFTs on admission showed an AST of 171 ALT of 198, LFTs today preoperatively normal.   Past Medical History  Diagnosis Date  . GERD (gastroesophageal reflux disease)     History reviewed. No pertinent past surgical history.  Prior to Admission medications   Medication Sig Start Date End Date Taking? Authorizing Provider  acetaminophen (TYLENOL) 325 MG tablet Take 650 mg by mouth every 6 (six) hours as needed (pain.).    Yes Historical Provider, MD  etonogestrel-ethinyl estradiol (NUVARING) 0.12-0.015 MG/24HR vaginal ring Place 1 each vaginally every 28 (twenty-eight) days. Insert vaginally and leave in place for 3 consecutive weeks, then remove for 1 week.   Yes Historical Provider, MD  ranitidine (ZANTAC) 150 MG capsule Take 150 mg by mouth 2 (two) times daily.   Yes Historical Provider, MD  sucralfate  (CARAFATE) 1 G tablet 1 g 2 (two) times daily. Take one 1 hr ac and hs 12/19/13  Yes Phillips Odor, MD    Current Facility-Administered Medications  Medication Dose Route Frequency Provider Last Rate Last Dose  . bisacodyl (DULCOLAX) suppository 10 mg  10 mg Rectal Daily PRN Romie Levee, MD      . chlorhexidine (PERIDEX) 0.12 % solution 15 mL  15 mL Mouth/Throat QID Valarie Merino, MD   15 mL at 05/21/14 2122  . dextrose 5 % and 0.45 % NaCl with KCl 20 mEq/L infusion   Intravenous Continuous Kandis Cocking, MD      . diphenhydrAMINE (BENADRYL) injection 12.5-25 mg  12.5-25 mg Intravenous Q6H PRN Valarie Merino, MD       Or  . diphenhydrAMINE (BENADRYL) 12.5 MG/5ML elixir 12.5-25 mg  12.5-25 mg Oral Q6H PRN Valarie Merino, MD      . heparin injection 5,000 Units  5,000 Units Subcutaneous 3 times per day Sammuel Cooper, PA-C   5,000 Units at 05/22/14 4098  . HYDROcodone-acetaminophen (NORCO/VICODIN) 5-325 MG per tablet 1-2 tablet  1-2 tablet Oral Q4H PRN Kandis Cocking, MD      . HYDROmorphone (DILAUDID) 1 MG/ML injection           . HYDROmorphone (DILAUDID) injection 0.5-1 mg  0.5-1 mg Intravenous Q2H PRN Sherrie George, PA-C   1 mg at 05/22/14 0249  . morphine 2 MG/ML injection 1-3 mg  1-3 mg Intravenous Q2H PRN Kandis Cocking, MD      . ondansetron [facility]) injection 4 mg  4 mg Intravenous Q6H PRN Molli Hazard  Hortencia ConradiB Martin, MD   4 mg at 05/21/14 0834  . pantoprazole (PROTONIX) injection 40 mg  40 mg Intravenous Q12H Sherrie GeorgeWillard Jennings, PA-C   40 mg at 05/21/14 2123    Allergies as of 05/17/2014 - Review Complete 05/17/2014  Allergen Reaction Noted  . Codeine  12/24/2013    No family history on file.  History   Social History  . Marital Status: Married    Spouse Name: N/A    Number of Children: N/A  . Years of Education: N/A   Occupational History  . Not on file.   Social History Main Topics  . Smoking status: Former Smoker    Quit date: 03/04/2000  . Smokeless tobacco:  Never Used  . Alcohol Use: Yes     Comment: very rare  . Drug Use: No  . Sexual Activity: Not on file   Other Topics Concern  . Not on file   Social History Narrative  . No narrative on file    Review of Systems: Pertinent positive and negative review of systems were noted in the above HPI section.  All other review of systems was otherwise negative.  Physical Exam: Vital signs in last 24 hours: Temp:  [97.8 F (36.6 C)-98.7 F (37.1 C)] 97.9 F (36.6 C) (08/10 1143) Pulse Rate:  [68-98] 70 (08/10 1143) Resp:  [12-18] 16 (08/10 1143) BP: (100-121)/(52-71) 113/63 mmHg (08/10 1143) SpO2:  [98 %-100 %] 100 % (08/10 1143) Last BM Date: 05/16/14 General:   Alert,  Well-developed, well-nourished,WF , pleasant and cooperative in NAD Head:  Normocephalic and atraumatic. Eyes:  Sclera clear, no icterus.   Conjunctiva pink. Ears:  Normal auditory acuity. Nose:  No deformity, discharge,  or lesions. Mouth:  No deformity or lesions.   Neck:  Supple; no masses or thyromegaly. Lungs:  Clear throughout to auscultation.   No wheezes, crackles, or rhonchi. Heart:  Regular rate and rhythm; no murmurs, clicks, rubs,  or gallops. Abdomen:  Soft,mildly tender diffusely , incisions benign BS active,nonpalp mass or hsm.   Rectal:  Deferred  Msk:  Symmetrical without gross deformities. . Pulses:  Normal pulses noted. Extremities:  Without clubbing or edema. Neurologic:  Alert and  oriented x4;  grossly normal neurologically. Skin:  Intact without significant lesions or rashes.. Psych:  Alert and cooperative. Normal mood and affect.  Intake/Output from previous day: 08/09 0701 - 08/10 0700 In: 1925 [P.O.:180; I.V.:1745] Out: 2450 [Urine:2450] Intake/Output this shift: Total I/O In: 1116.3 [I.V.:1116.3] Out: 100 [Urine:100]  Lab Results:  Recent Labs  05/20/14 0513 05/22/14 0450  WBC 10.2 7.0  HGB 10.5* 10.7*  HCT 32.1* 32.4*  PLT 228 291   BMET No results found for this  basename: NA, K, CL, CO2, GLUCOSE, BUN, CREATININE, CALCIUM,  in the last 72 hours LFT  Recent Labs  05/22/14 0450  PROT 6.6  ALBUMIN 2.6*  AST 14  ALT 29  ALKPHOS 67  BILITOT 0.3  BILIDIR <0.2  IBILI NOT CALCULATED   PT/INR No results found for this basename: LABPROT, INR,  in the last 72 hours Hepatitis Panel No results found for this basename: HEPBSAG, HCVAB, HEPAIGM, HEPBIGM,  in the last 72 hours    IMPRESSION:  #651  43 year old female with acute gallstone pancreatitis onset 05/17/2014, now status post laparoscopic cholecystectomy today with finding of common bile duct stones on IOC. #2 mild normocytic anemia  PLAN: #1 ERCP and stone extraction scheduled with Dr. Christella HartiganJacobs at 4;30 pm tomorrow afternoon. Procedure was discussed  in detail with the patient today including 5-6% risk of pancreatitis. She is agreeable to proceed. #2 continue clear liquids through breakfast tomorrow morning then n.p.o. #3 Unasyn pre-procedure, stop heparin in a.m.    Amy Esterwood  05/22/2014, 12:12 PM Attending MD note:   I have taken a history, examined the patient, and reviewed the chart and IOC which shows at least 3  Stones floating in the distal CBD which is mildly dilated.. I agree with the Advanced Practitioner's impression and recommendations to undergo ERCP/sphincterotomy and stone extraction. I have mentioned possibility of a biliary stent in case  stones could not be removed at one time. Pre- procedure antibiotics ordered.   Willa Rough [facility] Pager # [phone]

## 2014-05-23 NOTE — Anesthesia Postprocedure Evaluation (Signed)
  Anesthesia Post-op Note  Patient: Sandy Gilbert  Procedure(s) Performed: Procedure(s): ENDOSCOPIC RETROGRADE CHOLANGIOPANCREATOGRAPHY (ERCP) (N/A)  Patient Location: PACU  Anesthesia Type:General  Level of Consciousness: awake, alert  and oriented  Airway and Oxygen Therapy: Patient Spontanous Breathing  Post-op Pain: none  Post-op Assessment: Post-op Vital signs reviewed  Post-op Vital Signs: Reviewed  Last Vitals:  Filed Vitals:   05/23/14 1742  BP: 112/67  Pulse: 65  Temp:   Resp:     Complications: No apparent anesthesia complications

## 2014-05-23 NOTE — Interval H&P Note (Signed)
History and Physical Interval Note:  05/23/2014 3:50 PM  Sandy Gilbert  has presented today for surgery, with the diagnosis of STONES  The various methods of treatment have been discussed with the patient and family. After consideration of risks, benefits and other options for treatment, the patient has consented to  Procedure(s): ENDOSCOPIC RETROGRADE CHOLANGIOPANCREATOGRAPHY (ERCP) (N/A) as a surgical intervention .  The patient's history has been reviewed, patient examined, no change in status, stable for surgery.  I have reviewed the patient's chart and labs.  Questions were answered to the patient's satisfaction.     Rachael FeeJacobs, [name]

## 2014-05-23 NOTE — Transfer of Care (Signed)
Immediate Anesthesia Transfer of Care Note  Patient: Sandy GrahamAllison G Gilbert  Procedure(s) Performed: Procedure(s): ENDOSCOPIC RETROGRADE CHOLANGIOPANCREATOGRAPHY (ERCP) (N/A)  Patient Location: PACU  Anesthesia Type:General  Level of Consciousness: sedated  Airway & Oxygen Therapy: Patient Spontanous Breathing and Patient connected to face mask oxygen  Post-op Assessment: Report given to PACU RN and Post -op Vital signs reviewed and stable  Post vital signs: Reviewed and stable  Complications: No apparent anesthesia complications

## 2014-05-23 NOTE — Progress Notes (Signed)
    Progress Note   Subjective  feels okay, family visiting.    Objective   Vital signs in last 24 hours: Temp:  [97.7 F (36.5 C)-98.6 F (37 C)] 97.7 F (36.5 C) (08/11 0537) Pulse Rate:  [58-81] 58 (08/11 0537) Resp:  [12-18] 16 (08/11 0537) BP: (100-120)/(52-76) 102/67 mmHg (08/11 0537) SpO2:  [96 %-100 %] 100 % (08/11 0537) Last BM Date: 05/16/14 General: pleasant white female in NAD.  Neurologic:  Alert and oriented,  grossly normal neurologically. Psych:  Cooperative. Normal mood and affect.  Intake/Output from previous day: 08/10 0701 - 08/11 0700 In: 2753.8 [I.V.:2753.8] Out: 3500 [Urine:3500] Intake/Output this shift:    Lab Results:  Recent Labs  05/22/14 0450  WBC 7.0  HGB 10.7*  HCT 32.4*  PLT 291   BMET  Recent Labs  05/23/14 0503  NA 140  K 4.4  CL 102  CO2 28  GLUCOSE 126*  BUN 6  CREATININE 0.69  CALCIUM 9.0   LFT  Recent Labs  05/22/14 0450 05/23/14 0503  PROT 6.6 6.7  ALBUMIN 2.6* 2.6*  AST 14 77*  ALT 29 88*  ALKPHOS 67 165*  BILITOT 0.3 0.2*  BILIDIR <0.2  --   IBILI NOT CALCULATED  --    Studies/Results: Dg Cholangiogram Operative  05/22/2014   CLINICAL DATA:  Gallstones  EXAM: INTRAOPERATIVE CHOLANGIOGRAM  TECHNIQUE: Cholangiographic images from the C-arm fluoroscopic device were submitted for interpretation post-operatively. Please see the procedural report for the amount of contrast and the fluoroscopy time utilized.  COMPARISON:  None.  FINDINGS: Contrast fills the biliary tree and duodenum. The common bile duct is dilated. Several common duct stones are present.  IMPRESSION: Patent biliary tree.  Several common duct stones are present.   Electronically Signed   By: Maryclare BeanArt  Hoss M.D.   On: 05/22/2014 12:04     Assessment / Plan:    43 year old female with biliary pancreatitis, s/p lap chole yesterday. IOC reveals CBD stones. She is slightly cholestatic today. For ERCP with Dr. Christella HartiganJacobs this afternoon. She will get  pre-procedure dose of Unasyn   LOS: 6 days   Willette Clusteraula Guenther  05/23/2014, 10:26 AM   Attending MD note:   I have taken a history, examined the patient, and reviewed the chart. I agree with the Advanced Practitioner's impression and recommendations. LFT's more elevated today but pt feeling  Only mild pain. RUQ tender. For ERCP today.  Willa Roughora Chantell Kunkler,MD [facility] Pager # [phone]

## 2014-05-23 NOTE — Anesthesia Preprocedure Evaluation (Signed)
Anesthesia Evaluation  Patient identified by MRN, date of birth, ID band Patient awake    Reviewed: Allergy & Precautions, H&P , NPO status , Patient's Chart, lab work & pertinent test results  Airway Mallampati: II TM Distance: >3 FB Neck ROM: Full    Dental no notable dental hx.    Pulmonary neg pulmonary ROS, former smoker,  breath sounds clear to auscultation  Pulmonary exam normal       Cardiovascular negative cardio ROS  Rhythm:Regular Rate:Normal     Neuro/Psych negative neurological ROS  negative psych ROS   GI/Hepatic Neg liver ROS, GERD-  Medicated,  Endo/Other  negative endocrine ROS  Renal/GU negative Renal ROS     Musculoskeletal negative musculoskeletal ROS (+)   Abdominal   Peds  Hematology negative hematology ROS (+)   Anesthesia Other Findings   Reproductive/Obstetrics negative OB ROS                           Anesthesia Physical  Anesthesia Plan  ASA: II  Anesthesia Plan: General   Post-op Pain Management:    Induction: Intravenous  Airway Management Planned: Oral ETT  Additional Equipment:   Intra-op Plan:   Post-operative Plan: Extubation in OR  Informed Consent: I have reviewed the patients History and Physical, chart, labs and discussed the procedure including the risks, benefits and alternatives for the proposed anesthesia with the patient or authorized representative who has indicated his/her understanding and acceptance.   Dental advisory given  Plan Discussed with: CRNA  Anesthesia Plan Comments:         Anesthesia Quick Evaluation

## 2014-05-23 NOTE — Progress Notes (Signed)
Patient came back to unit at this time, had ERCP, alert  And orientedx4. Placed comfortably in bed.- Hulda Marinonna Loralei Radcliffe RN

## 2014-05-23 NOTE — Op Note (Signed)
[facility] [address] [place], [id number]   ERCP PROCEDURE REPORT  PATIENT: Sandy Gilbert, Sandy G.  MR# :[id number] BIRTHDATE: April 28, 1971  GENDER: Female ENDOSCOPIST: Sandy Feeaniel P Denasia Venn, MD REFERRED BY: Ovidio Kinavid Newman, M.D. PROCEDURE DATE:  05/23/2014 PROCEDURE:   ERCP with sphincterotomy/papillotomy and ERCP with removal of calculus/calculi ASA CLASS:   Class II INDICATIONS:stones in CBD on IOC yesterday. MEDICATIONS: General endotracheal anesthesia (GETA) and Cipro 400 mg IV TOPICAL ANESTHETIC: none  DESCRIPTION OF PROCEDURE:   After the risks benefits and alternatives of the procedure were thoroughly explained, informed consent was obtained.  The Pentax ERCP [id number]  endoscope was introduced through the mouth  and advanced to the second portion of the duodenum  without detailed examination of the UGI tract. The major papilla was normal.  Scout film showed clips in RUQ. A 44 Autotome over a .035hydrawire was used to cannulate the bile duct and contrast was injected. Cholangiogram revealed diffusely dilated CBD (up to 12mm) that contained 2-3 round, mobile, 5-796mm stones. There were no bilary leaks. An adequate biliary sphincterotomy was performed and then the bile duct was swept several times with a biliary balloon.  Three black-green, smooth stones were delivered into the duodenum.  There was no purulence or excessive bleeding. A completion, occlution cholangiogram showed no persistent stones in the bile duct.  The scope was then completely withdrawn from the patient and the procedure terminated. The main pancreatic duct was never cannulated with the wire or injected with dye.     COMPLICATIONS: no immediate  ENDOSCOPIC IMPRESSION: Choledocholiathiasis, treated with biliary sphincterotomy and balloon sweeping.  RECOMMENDATIONS: Observe clinically overnight and OK to go home tomorrow if she is doing well. She will be given 100mg  rectal indomethacin in  recovery to reduce risk of post ERCP pancreatitis.    _______________________________ eSigned:  Rachael Feeaniel P Allina Riches, MD 05/23/2014 5:01 PM   CC:

## 2014-05-24 ENCOUNTER — Encounter (HOSPITAL_COMMUNITY): Payer: Self-pay | Admitting: Gastroenterology

## 2014-05-24 ENCOUNTER — Other Ambulatory Visit: Payer: Self-pay | Admitting: Nurse Practitioner

## 2014-05-24 LAB — COMPREHENSIVE METABOLIC PANEL
ALK PHOS: 242 U/L — AB (ref 39–117)
ALT: 343 U/L — AB (ref 0–35)
AST: 359 U/L — ABNORMAL HIGH (ref 0–37)
Albumin: 2.7 g/dL — ABNORMAL LOW (ref 3.5–5.2)
Anion gap: 9 (ref 5–15)
BILIRUBIN TOTAL: 0.4 mg/dL (ref 0.3–1.2)
BUN: 7 mg/dL (ref 6–23)
CHLORIDE: 103 meq/L (ref 96–112)
CO2: 27 meq/L (ref 19–32)
Calcium: 8.9 mg/dL (ref 8.4–10.5)
Creatinine, Ser: 0.63 mg/dL (ref 0.50–1.10)
GFR calc non Af Amer: >90 mL/min (ref >90)
GLUCOSE: 125 mg/dL — AB (ref 70–99)
POTASSIUM: 4.4 meq/L (ref 3.7–5.3)
SODIUM: 139 meq/L (ref 137–147)
TOTAL PROTEIN: 6.6 g/dL (ref 6.0–8.3)

## 2014-05-24 LAB — CBC
HCT: 32.5 % — ABNORMAL LOW (ref 36.0–46.0)
HEMOGLOBIN: 10.7 g/dL — AB (ref 12.0–15.0)
MCH: 28.1 pg (ref 26.0–34.0)
MCHC: 32.9 g/dL (ref 30.0–36.0)
MCV: 85.3 fL (ref 78.0–100.0)
Platelets: 315 10*3/uL (ref 150–400)
RBC: 3.81 MIL/uL — AB (ref 3.87–5.11)
RDW: 12.5 % (ref 11.5–15.5)
WBC: 5.7 10*3/uL (ref 4.0–10.5)

## 2014-05-24 LAB — LIPASE, BLOOD: Lipase: 247 U/L — ABNORMAL HIGH (ref 11–59)

## 2014-05-24 MED ORDER — IBUPROFEN 200 MG PO TABS: ORAL_TABLET | ORAL | Status: DC

## 2014-05-24 MED ORDER — ACETAMINOPHEN 325 MG PO TABS: ORAL_TABLET | ORAL | Status: DC

## 2014-05-24 MED ORDER — HYDROCODONE-ACETAMINOPHEN 5-325 MG PO TABS: 1.0000 | ORAL_TABLET | ORAL | Status: DC | PRN

## 2014-05-24 MED ORDER — POLYETHYLENE GLYCOL 3350 17 G PO PACK: 17.0000 g | PACK | Freq: Every day | ORAL | Status: DC | PRN

## 2014-05-24 MED ORDER — POLYETHYLENE GLYCOL 3350 17 G PO PACK
17.0000 g | PACK | Freq: Every day | ORAL | Status: DC
  Administered 2014-05-24: 17 g via ORAL
  Filled 2014-05-24: qty 1

## 2014-05-24 NOTE — Discharge Summary (Signed)
Physician Discharge Summary  Patient ID: Sandy Gilbert MRN: [id number] DOB/AGE: 1970-11-30 43 y.o.  Admit date: 05/17/2014 Discharge date: 05/24/2014  Admission Diagnoses:  Gallstone Pancreatitis GERD Hx of tobacco use   Discharge Diagnoses:  1. Gallstone pancreatitis 2. Choledocholithiasis s/p ERCP/sphincterotomy/papillotomy, ERCP with balloon removal fo calculus/calculi, 05/23/14, Dr. Christella HartiganJacobs  3. GERD  4. Hx of tobacco use   Active Problems:   Pancreatitis   Gallstones   Obstruction of bile duct   Nonspecific (abnormal) findings on radiological and other examination of biliary tract   PROCEDURES: 1.  LAPAROSCOPIC CHOLECYSTECTOMY WITH INTRAOPERATIVE CHOLANGIOGRAM - 05/22/14, Dr. Ezzard StandingNewman  2.  ERCP/sphincterotomy/papillotomy, ERCP with balloon removal fo calculus/calculi, 05/23/14, Dr. Margorie JohnJacobs  Hospital Course: Sandy Gilbert is an 43 y.o. female with a two day history of abdominal pain and nausea who presented to the ER for evaluation. Ultrasound shows gallstones but lipase is 2926. She is admitted with a presumed diagnosis of gallstone pancreatitis. She was admitted after ER evaluation, he had an MRCP that showed: Acute pancreatitis. No drainable fluid collection/pseudocyst. No evidence of pancreatic necrosis.  Cholelithiasis with mild gallbladder distention. No associated  findings to suggest acute cholecystitis.  Dilated common duct, measuring up to 14 mm, tapering to 7 mm distally. No choledocholithiasis is seen.  The pancreatitis slowly improved and she was taken to the OR for cholecystectomy on 05/22/14.  Her IOC showed multiple stones in the CBD and she was seen by GI.  She underwent ERCP on 05/23/14 by Dr. Christella HartiganJacobs.  Post procedure she is taking a regular diet without issue.  Her lipase, AST, and ALT are up post procedure.  She feels fine and is ready for discharge.  Dr. Christella HartiganJacobs office will have her come back for follow up of her LFT'S in 1 month.  She will come back to our office, in  2-3 weeks. Sites all look fine, and she was ready for discharge.  Condition on discharge:  Improved  Disposition:  Home     Medication List         acetaminophen 325 MG tablet  Commonly known as:  TYLENOL  Do not take more than 4000 mg of Tylenol (acetaminophen) in any 24 hour period.  This is in your prescribed pain medicine so you have to count it.     etonogestrel-ethinyl estradiol 0.12-0.015 MG/24HR vaginal ring  Commonly known as:  NUVARING  Place 1 each vaginally every 28 (twenty-eight) days. Insert vaginally and leave in place for 3 consecutive weeks, then remove for 1 week.     HYDROcodone-acetaminophen 5-325 MG per tablet  Commonly known as:  NORCO/VICODIN  Take 1-2 tablets by mouth every 4 (four) hours as needed for moderate pain.     ibuprofen 200 MG tablet  Commonly known as:  MOTRIN IB  You can take 2-3 tablets every 6 hours as needed.     polyethylene glycol packet  Commonly known as:  MIRALAX / GLYCOLAX  Take 17 g by mouth daily as needed (Try to increase your home diet to High fiber to help with constipation issues.).     ranitidine 150 MG capsule  Commonly known as:  ZANTAC  Take 150 mg by mouth 2 (two) times daily.     sucralfate 1 G tablet  Commonly known as:  CARAFATE  1 g 2 (two) times daily. Take one 1 hr ac and hs       Follow-up Information   [facility] Morrisette [facility], [facility]. Schedule an appointment as soon  as possible for a visit in 2 weeks. (Make an appointment for 2-3 weeks)    Specialty:  General Surgery   Contact information:   [address] [place] [address] [phone]       Follow up with Nilda Simmer, MD. (Let her know you had surgery and ERCP.)    Specialty:  Family Medicine   Contact information:   [address] [place] [address] [phone]       Follow up with Rachael Fee, MD. Schedule an appointment as soon as possible for a visit in 1 month. (to check your liver function studies)     Specialty:  Gastroenterology   Contact information:   [address] [place] [address] [phone]       Signed: Sherrie George 05/24/2014, 11:21 AM  Agree with above.  Ovidio Kin, MD, [facility] Surgery Pager: [phone] Office phone:  [phone]

## 2014-05-24 NOTE — Discharge Instructions (Signed)
Laparoscopic Cholecystectomy, Care After Refer to this sheet in the next few weeks. These instructions provide you with information on caring for yourself after your procedure. Your health care provider may also give you more specific instructions. Your treatment has been planned according to current medical practices, but problems sometimes occur. Call your health care provider if you have any problems or questions after your procedure. WHAT TO EXPECT AFTER THE PROCEDURE After your procedure, it is typical to have the following:  Pain at your incision sites. You will be given pain medicines to control the pain.  Mild nausea or vomiting. This should improve after the first 24 hours.  Bloating and possibly shoulder pain from the gas used during the procedure. This will improve after the first 24 hours. HOME CARE INSTRUCTIONS   Change bandages (dressings) as directed by your health care provider.  Keep the wound dry and clean. You may wash the wound gently with soap and water. Gently blot or dab the area dry.  Do not take baths or use swimming pools or hot tubs for 2 weeks or until your health care provider approves.  Only take over-the-counter or prescription medicines as directed by your health care provider.  Continue your normal diet as directed by your health care provider.  Do not lift anything heavier than 10 pounds (4.5 kg) until your health care provider approves.  Do not play contact sports for 1 week or until your health care provider approves. SEEK MEDICAL CARE IF:   You have redness, swelling, or increasing pain in the wound.  You notice yellowish-white fluid (pus) coming from the wound.  You have drainage from the wound that lasts longer than 1 day.  You notice a bad smell coming from the wound or dressing.  Your surgical cuts (incisions) break open. SEEK IMMEDIATE MEDICAL CARE IF:   You develop a rash.  You have difficulty breathing.  You have chest pain.  You  have a fever.  You have increasing pain in the shoulders (shoulder strap areas).  You have dizzy episodes or faint while standing.  You have severe abdominal pain.  You feel sick to your stomach (nauseous) or throw up (vomit) and this lasts for more than 1 day. Document Released: 09/29/2005 Document Revised: 07/20/2013 Document Reviewed: 05/11/2013 [facility] Patient Information 2015 [place], [place]. This information is not intended to replace advice given to you by your health care provider. Make sure you discuss any questions you have with your health care provider. [facility] ______[facility] LAPAROSCOPIC SURGERY: POST OP INSTRUCTIONS Always review your discharge instruction sheet given to you by the facility where your surgery was performed. IF YOU HAVE DISABILITY OR FAMILY LEAVE FORMS, YOU MUST BRING THEM TO THE OFFICE FOR PROCESSING.   DO NOT GIVE THEM TO YOUR DOCTOR.  1. A prescription for pain medication may be given to you upon discharge.  Take your pain medication as prescribed, if needed.  If narcotic pain medicine is not needed, then you may take acetaminophen (Tylenol) or ibuprofen (Advil) as needed. 2. Take your usually prescribed medications unless otherwise directed. 3. If you need a refill on your pain medication, please contact your pharmacy.  They will contact our office to request authorization. Prescriptions will not be filled after 5pm or on week-ends. 4. You should follow a light diet the first few days after arrival home, such as soup and crackers, etc.  Be sure to include lots of fluids daily. 5. Most patients will experience some swelling  and bruising in the area of the incisions.  Ice packs will help.  Swelling and bruising can take several days to resolve.  6. It is common to experience some constipation if taking pain medication after surgery.  Increasing fluid intake and taking a stool softener (such as Colace) will usually help or prevent this problem  from occurring.  A mild laxative (Milk of Magnesia or Miralax) should be taken according to package instructions if there are no bowel movements after 48 hours. 7. Unless discharge instructions indicate otherwise, you may remove your bandages 24-48 hours after surgery, and you may shower at that time.  You may have steri-strips (small skin tapes) in place directly over the incision.  These strips should be left on the skin for 7-10 days.  If your surgeon used skin glue on the incision, you may shower in 24 hours.  The glue will flake off over the next 2-3 weeks.  Any sutures or staples will be removed at the office during your follow-up visit. 8. ACTIVITIES:  You may resume regular (light) daily activities beginning the next day--such as daily self-care, walking, climbing stairs--gradually increasing activities as tolerated.  You may have sexual intercourse when it is comfortable.  Refrain from any heavy lifting or straining until approved by your doctor. a. You may drive when you are no longer taking prescription pain medication, you can comfortably wear a seatbelt, and you can safely maneuver your car and apply brakes. b. RETURN TO WORK:  __________________________________________________________ 9. You should see your doctor in the office for a follow-up appointment approximately 2-3 weeks after your surgery.  Make sure that you call for this appointment within a day or two after you arrive home to insure a convenient appointment time. 10. OTHER INSTRUCTIONS: __________________________________________________________________________________________________________________________ __________________________________________________________________________________________________________________________ WHEN TO CALL YOUR DOCTOR: 1. Fever over 101.0 2. Inability to urinate 3. Continued bleeding from incision. 4. Increased pain, redness, or drainage from the incision. 5. Increasing abdominal pain  The  clinic staff is available to answer your questions during regular business hours.  Please dont hesitate to call and ask to speak to one of the nurses for clinical concerns.  If you have a medical emergency, go to the nearest emergency room or call 911.  A surgeon from [facility] is always on call at the hospital. [address], [address], [place], [place]  [address] ? [address] [address], [place], [place]   [id number] [phone] ? [phone] ? FAX [phone] Web site: [email or web address]  Endoscopic Retrograde Cholangiopancreatography (ERCP) by Dr. Rob Buntinganiel Jacobs Endoscopic retrograde cholangiopancreatography (ERCP) is a procedure used to diagnosis many diseases of the pancreas, bile ducts, liver, and gallbladder. During ERCP a thin, lighted tube (endoscope) is passed through the mouth and down the back of the throat into the first part of the small intestine (duodenum). A small, plastic tube (cannula) is then passed through the endoscope and directed into the bile duct or pancreatic duct. Dye is then injected through the cannula and X-rays are taken to study the biliary and pancreatic passageways.  LET [facility]ARE PROVIDER KNOW ABOUT:   Any allergies you have.   All medicines you are taking, including vitamins, herbs, eyedrops, creams, and over-the-counter medicines.   Previous problems you or members of your family have had with the use of anesthetics.   Any blood disorders you have.   Previous surgeries you have had.   Medical conditions you have. RISKS AND COMPLICATIONS Generally, ERCP is a safe procedure. However,  as with any procedure, complications can occur. A simple removal of gallstones has the lowest rate of complications. Higher rates of complication occur in people who have poorly functioning bile or pancreatic ducts. Possible complications include:   Pancreatitis.  Bleeding.  Accidental punctures in the bowel wall, pancreas, or gall  bladder.  Gall bladder or bile duct infection. BEFORE THE PROCEDURE   Do not eat or drink anything, including water, for at least 8 hours before the procedure or as directed by your health care provider.   Ask your health care provider whether you should stop taking certain medicines prior to your procedure.   Arrange for someone to drive you home. You will not be allowed to drive for 40-98 hours after the procedure. PROCEDURE   You will be given medicine through a vein (intravenously) to make you relaxed and sleepy.   You might have a breathing tube placed to give you medicine that makes you sleep (general anesthetic).   Your throat may be sprayed with medicine that numbs the area and prevents gagging (local anesthetic), or you may gargle this medicine.   You will lie on your left side.   The endoscope will be inserted through your mouth and into the duodenum. The tube will not interfere with your breathing. Gagging is prevented by the anesthesia.   While X-rays are being taken, you may be positioned on your stomach.   A small sample of tissue (biopsy) may be removed for examination. AFTER THE PROCEDURE   You will rest in bed until you are fully conscious.   When you first wake up, your throat may feel slightly sore.   You will not be allowed to eat or drink until numbness subsides.   Once you are able to drink, urinate, and sit on the edge of the bed without feeling sick to your stomach (nauseous) or dizzy, you may be allowed to go home. Document Released: 06/24/2001 Document Revised: 07/20/2013 Document Reviewed: 05/10/2013 [facility] Patient Information 2015 [place], [place]. This information is not intended to replace advice given to you by your health care provider. Make sure you discuss any questions you have with your health care provider. They want to check your liver functions studies in 4 weeks.  High-Fiber Diet Fiber is found in fruits, vegetables, and  grains. A high-fiber diet encourages the addition of more whole grains, legumes, fruits, and vegetables in your diet. The recommended amount of fiber for adult males is 38 g per day. For adult females, it is 25 g per day. Pregnant and lactating women should get 28 g of fiber per day. If you have a digestive or bowel problem, ask your caregiver for advice before adding high-fiber foods to your diet. Eat a variety of high-fiber foods instead of only a select few type of foods.  PURPOSE  To increase stool bulk.  To make bowel movements more regular to prevent constipation.  To lower cholesterol.  To prevent overeating. WHEN IS THIS DIET USED?  It may be used if you have constipation and hemorrhoids.  It may be used if you have uncomplicated diverticulosis (intestine condition) and irritable bowel syndrome.  It may be used if you need help with weight management.  It may be used if you want to add it to your diet as a protective measure against atherosclerosis, diabetes, and cancer. SOURCES OF FIBER  Whole-grain breads and cereals.  Fruits, such as apples, oranges, bananas, berries, prunes, and pears.  Vegetables, such as green peas, carrots,  sweet potatoes, beets, broccoli, cabbage, spinach, and artichokes.  Legumes, such split peas, soy, lentils.  Almonds. FIBER CONTENT IN FOODS Starches and Grains / Dietary Fiber (g)  Cheerios, 1 cup / 3 g  Corn Flakes cereal, 1 cup / 0.7 g  Rice crispy treat cereal, 1 cup / 0.3 g  Instant oatmeal (cooked),  cup / 2 g  Frosted wheat cereal, 1 cup / 5.1 g  Brown, long-grain rice (cooked), 1 cup / 3.5 g  White, long-grain rice (cooked), 1 cup / 0.6 g  Enriched macaroni (cooked), 1 cup / 2.5 g Legumes / Dietary Fiber (g)  Baked beans (canned, plain, or vegetarian),  cup / 5.2 g  Kidney beans (canned),  cup / 6.8 g  Pinto beans (cooked),  cup / 5.5 g Breads and Crackers / Dietary Fiber (g)  Plain or honey graham crackers, 2  squares / 0.7 g  Saltine crackers, 3 squares / 0.3 g  Plain, salted pretzels, 10 pieces / 1.8 g  Whole-wheat bread, 1 slice / 1.9 g  White bread, 1 slice / 0.7 g  Raisin bread, 1 slice / 1.2 g  Plain bagel, 3 oz / 2 g  Flour tortilla, 1 oz / 0.9 g  Corn tortilla, 1 small / 1.5 g  Hamburger or hotdog bun, 1 small / 0.9 g Fruits / Dietary Fiber (g)  Apple with skin, 1 medium / 4.4 g  Sweetened applesauce,  cup / 1.5 g  Banana,  medium / 1.5 g  Grapes, 10 grapes / 0.4 g  Orange, 1 small / 2.3 g  Raisin, 1.5 oz / 1.6 g  Melon, 1 cup / 1.4 g Vegetables / Dietary Fiber (g)  Green beans (canned),  cup / 1.3 g  Carrots (cooked),  cup / 2.3 g  Broccoli (cooked),  cup / 2.8 g  Peas (cooked),  cup / 4.4 g  Mashed potatoes,  cup / 1.6 g  Lettuce, 1 cup / 0.5 g  Corn (canned),  cup / 1.6 g  Tomato,  cup / 1.1 g Document Released: 09/29/2005 Document Revised: 03/30/2012 Document Reviewed: 01/01/2012 [employer] Patient Information 2015 [place], [place]. This information is not intended to replace advice given to you by your health care provider. Make sure you discuss any questions you have with your health care provider.

## 2014-05-24 NOTE — Progress Notes (Signed)
Discharge instructions reviewed with patient utilizing teach back method no questions at this time. Patient discharged to home to home

## 2014-05-24 NOTE — Progress Notes (Signed)
1 Day Post-Op  Subjective: She feels fine, tolerating diet.  Still constipated and she has some chronic constipation issues.  She will take some Miralax at home and start a high fiber diet when she gets home.  Objective: Vital signs in last 24 hours: Temp:  [97.7 F (36.5 C)-98.6 F (37 C)] 97.7 F (36.5 C) (08/12 0936) Pulse Rate:  [58-72] 64 (08/12 0936) Resp:  [10-18] 16 (08/12 0936) BP: (100-119)/(55-73) 111/64 mmHg (08/12 0936) SpO2:  [98 %-100 %] 100 % (08/12 0936) Last BM Date: 05/16/14 1680 PO Regular diet Afebrile, VSS Lipase up to 247 this Am, ALT/AST up some WBC is normal Intake/Output from previous day: 08/11 0701 - 08/12 0700 In: 5146.7 [P.O.:1680; I.V.:3466.7] Out: 3950 [Urine:3950] Intake/Output this shift: Total I/O In: 120 [P.O.:120] Out: -   General appearance: alert, cooperative and no distress GI: soft, still sore, no distension, sites look fine.  Lab Results:   Recent Labs  05/22/14 0450 05/24/14 0450  WBC 7.0 5.7  HGB 10.7* 10.7*  HCT 32.4* 32.5*  PLT 291 315    BMET  Recent Labs  05/23/14 0503 05/24/14 0450  NA 140 139  K 4.4 4.4  CL 102 103  CO2 28 27  GLUCOSE 126* 125*  BUN 6 7  CREATININE 0.69 0.63  CALCIUM 9.0 8.9   PT/INR No results found for this basename: LABPROT, INR,  in the last 72 hours   Recent Labs Lab 05/19/14 0455 05/20/14 0513 05/22/14 0450 05/23/14 0503 05/24/14 0450  AST 27 17 14  77* 359*  ALT 63* 45* 29 88* 343*  ALKPHOS 90 136* 67 165* 242*  BILITOT 0.5 0.4 0.3 0.2* 0.4  PROT 5.6* 6.1 6.6 6.7 6.6  ALBUMIN 2.3* 2.4* 2.6* 2.6* 2.7*     Lipase     Component Value Date/Time   LIPASE 247* 05/24/2014 0450     Studies/Results: Dg Cholangiogram Operative  05/22/2014   CLINICAL DATA:  Gallstones  EXAM: INTRAOPERATIVE CHOLANGIOGRAM  TECHNIQUE: Cholangiographic images from the C-arm fluoroscopic device were submitted for interpretation post-operatively. Please see the procedural report for the amount  of contrast and the fluoroscopy time utilized.  COMPARISON:  None.  FINDINGS: Contrast fills the biliary tree and duodenum. The common bile duct is dilated. Several common duct stones are present.  IMPRESSION: Patent biliary tree.  Several common duct stones are present.   Electronically Signed   By: Maryclare Bean M.D.   On: 05/22/2014 12:04   Dg Ercp With Sphincterotomy  05/24/2014   CLINICAL DATA:  Choledocholithiasis.  EXAM: ERCP  TECHNIQUE: Multiple spot images obtained with the fluoroscopic device and submitted for interpretation post-procedure.  COMPARISON:  Intraoperative cholangiogram on 05/22/2014  FINDINGS: Imaging obtained during ERCP demonstrates endoscopic cannulation of the common bile duct. Cholangiogram shows multiple rounded filling defects in a dilated common bile duct. Balloon sweep maneuver was performed.  IMPRESSION: Choledocholithiasis with common bile duct stone extraction.  These images were submitted for radiologic interpretation only. Please see the procedural report for the amount of contrast and the fluoroscopy time utilized.   Electronically Signed   By: Irish Lack M.D.   On: 05/24/2014 08:05    Medications: . chlorhexidine  15 mL Mouth/Throat QID  . pantoprazole (PROTONIX) IV  40 mg Intravenous Q12H    Assessment/Plan 1.  Gallstone pancreatitis; s/p LAPAROSCOPIC CHOLECYSTECTOMY WITH INTRAOPERATIVE CHOLANGIOGRAM 05/22/14, Dr. Ezzard Standing 2.  Choledocholithiasis s/p ERCP/sphincterotomy/papillotomy, ERCP with balloon removal fo calculus/calculi, 05/23/14, Dr. Christella Hartigan 3.  GERD  4.  Hx  of tobacco use   Plan:  Home today, GI will recheck her LFT's in 1 month, and we will see her in clinic in a few weeks.   LOS: 7 days    JENNINGS,WILLARD 05/24/2014  Agree with above.  Ovidio Kinavid Canaan Holzer, MD, [facility] Pager: [phone] Office phone:  [phone]

## 2014-05-24 NOTE — Progress Notes (Signed)
    Progress Note   Subjective  feels fine, ready to go home.    Objective   Vital signs in last 24 hours: Temp:  [97.8 F (36.6 C)-98.6 F (37 C)] 98.6 F (37 C) (08/12 0630) Pulse Rate:  [58-72] 68 (08/12 0630) Resp:  [10-18] 16 (08/12 0630) BP: (100-119)/(55-73) 111/64 mmHg (08/12 0630) SpO2:  [98 %-100 %] 100 % (08/12 0630) Last BM Date: 05/16/14 General:    white female in NAD Heart:  Regular rate and rhythm Lungs: Respirations even and unlabored, lungs CTA bilaterally Abdomen:  Soft, nontender and nondistended. Normal bowel sounds. Neurologic:  Alert and oriented,  grossly normal neurologically. Psych:  Cooperative. Normal mood and affect.    Lab Results:  Recent Labs  05/22/14 0450 05/24/14 0450  WBC 7.0 5.7  HGB 10.7* 10.7*  HCT 32.4* 32.5*  PLT 291 315   BMET  Recent Labs  05/23/14 0503 05/24/14 0450  NA 140 139  K 4.4 4.4  CL 102 103  CO2 28 27  GLUCOSE 126* 125*  BUN 6 7  CREATININE 0.69 0.63  CALCIUM 9.0 8.9   LFT  Recent Labs  05/22/14 0450  05/24/14 0450  PROT 6.6  < > 6.6  ALBUMIN 2.6*  < > 2.7*  AST 14  < > 359*  ALT 29  < > 343*  ALKPHOS 67  < > 242*  BILITOT 0.3  < > 0.4  BILIDIR <0.2  --   --   IBILI NOT CALCULATED  --   --   < > = values in this interval not displayed.    Assessment / Plan:   43 year old female with biliary pancreatitis, s/p lap chole 8/10 followed by ERCP with stone extraction and sphincterotomy yesterday. Significant rise in LFTs overnight suspect but expect they will normalize over next several days. Asked patient to have repeat LFTs in one month at our office, order in EPIC      LOS: 7 days   Willette Clusteraula Guenther  05/24/2014, 8:54 AM  Attending MD note:   I have taken a history,  and reviewed the chart. I agree with the Advanced Practitioner's impression and recommendations. OK to discharge, follow up LFT's till they return to normal.  Willa Roughora Brodie,MD [facility] Pager # [phone]

## 2014-05-26 ENCOUNTER — Telehealth (INDEPENDENT_AMBULATORY_CARE_PROVIDER_SITE_OTHER): Payer: Self-pay

## 2014-05-26 ENCOUNTER — Telehealth: Payer: Self-pay

## 2014-05-26 NOTE — Telephone Encounter (Signed)
Called to check on her- she is doing ok

## 2014-05-26 NOTE — Telephone Encounter (Signed)
Pt s/p lap chole on 05/22/14 by Dr Ezzard StandingNewman. Pt states that she has not had a BM since before sx. Advised pt per protocol that she can take MOM as directed on the label, drink plenty of water, decrease narcotics and she can take Ibuprofen as needed for pain, and ambulate as much as possible. Pt will call our office if she has not had a BM. Pt will need a f/u appt to see Dr Ezzard StandingNewman will have Glenda schedule this and contact pt with appt time and date.

## 2014-05-26 NOTE — Telephone Encounter (Signed)
Pt called in and wanted to let Dr Patsy Lageropland know she ended up having Gallbladder surgery to remove it and an ERCP procedure. She just wanted to let her know.

## 2014-06-08 ENCOUNTER — Ambulatory Visit (INDEPENDENT_AMBULATORY_CARE_PROVIDER_SITE_OTHER): Payer: BC Managed Care – PPO | Admitting: Surgery

## 2014-06-08 ENCOUNTER — Encounter (INDEPENDENT_AMBULATORY_CARE_PROVIDER_SITE_OTHER): Payer: Self-pay | Admitting: Surgery

## 2014-06-08 VITALS — BP 126/72 | HR 68 | Temp 98.0°F | Resp 18 | Ht 66.0 in | Wt 156.0 lb

## 2014-06-08 DIAGNOSIS — K8020 Calculus of gallbladder without cholecystitis without obstruction: Secondary | ICD-10-CM

## 2014-06-08 NOTE — Progress Notes (Signed)
[facility]  Ovidio Kin, MD,  FACS [address] [place]  [address] [place], [place]    [address] Phone:  [phone] FAX:  [phone]   Re:   Sandy Gilbert DOB:   Sep 05, 1971 MRN:   [id number]  ASSESSMENT AND PLAN: 1.  Lap chole - 8/102015 - D. Ezzard Standing  She has done very well post op.  I see no reason to repeat her labs, unless she were to have further symptoms.  I made her return appt PRN.  2.  Choledocholithiasis - 05/23/2014 - Sandy Gilbert  3. History of gall stone pancreatitis.  HISTORY OF PRESENT ILLNESS: Chief Complaint  Patient presents with  . Routine Post Op    Sandy Gilbert is a 43 y.o. (DOB: [birth year])  white  female who is a patient of SMITH,KRISTI, MD and comes to me today for follow up for gall bladder disease. She comes by herself. She is doing well, has no complaints, is eating well.  Past Medical History  Diagnosis Date  . GERD (gastroesophageal reflux disease)    SOCIAL HISTORY: She is back to work.    PHYSICAL EXAM: BP 126/72  Pulse 68  Temp(Src) 98 F (36.7 C)  Resp 18  Ht  (1.676 m)  Wt 156 lb (70.761 kg)  BMI 25.19 kg/m2  LMP 04/16/2014  Abdomen:  Wounds look good.  BS present.   DATA REVIEWED: Epic notes.  Ovidio Kin, MD,  [facility] [address] [place]  [address]   [place], [place]    [address] Phone:  [phone] FAX:  [phone]

## 2014-06-13 ENCOUNTER — Other Ambulatory Visit: Payer: Self-pay | Admitting: Nurse Practitioner

## 2014-06-13 DIAGNOSIS — R945 Abnormal results of liver function studies: Secondary | ICD-10-CM

## 2014-06-13 DIAGNOSIS — R7989 Other specified abnormal findings of blood chemistry: Secondary | ICD-10-CM

## 2014-07-19 ENCOUNTER — Emergency Department (HOSPITAL_COMMUNITY)
Admission: EM | Admit: 2014-07-19 | Discharge: 2014-07-19 | Disposition: A | Discharge status: Home / Self Care | Payer: BC Managed Care – PPO | Attending: Emergency Medicine | Admitting: Emergency Medicine

## 2014-07-19 ENCOUNTER — Encounter (HOSPITAL_COMMUNITY): Payer: Self-pay | Admitting: Emergency Medicine

## 2014-07-19 DIAGNOSIS — F419 Anxiety disorder, unspecified: Secondary | ICD-10-CM | POA: Diagnosis present

## 2014-07-19 DIAGNOSIS — Z87891 Personal history of nicotine dependence: Secondary | ICD-10-CM | POA: Diagnosis not present

## 2014-07-19 DIAGNOSIS — Z3202 Encounter for pregnancy test, result negative: Secondary | ICD-10-CM | POA: Diagnosis not present

## 2014-07-19 DIAGNOSIS — R202 Paresthesia of skin: Secondary | ICD-10-CM | POA: Insufficient documentation

## 2014-07-19 DIAGNOSIS — Z8719 Personal history of other diseases of the digestive system: Secondary | ICD-10-CM | POA: Insufficient documentation

## 2014-07-19 DIAGNOSIS — R51 Headache: Secondary | ICD-10-CM | POA: Diagnosis not present

## 2014-07-19 DIAGNOSIS — R519 Headache, unspecified: Secondary | ICD-10-CM

## 2014-07-19 LAB — CBC WITH DIFFERENTIAL/PLATELET
BASOS PCT: 1 % (ref 0–1)
Basophils Absolute: 0 10*3/uL (ref 0.0–0.1)
EOS PCT: 4 % (ref 0–5)
Eosinophils Absolute: 0.2 10*3/uL (ref 0.0–0.7)
HCT: 38.4 % (ref 36.0–46.0)
Hemoglobin: 12.8 g/dL (ref 12.0–15.0)
LYMPHS ABS: 2.9 10*3/uL (ref 0.7–4.0)
Lymphocytes Relative: 49 % — ABNORMAL HIGH (ref 12–46)
MCH: 28.7 pg (ref 26.0–34.0)
MCHC: 33.3 g/dL (ref 30.0–36.0)
MCV: 86.1 fL (ref 78.0–100.0)
MONOS PCT: 6 % (ref 3–12)
Monocytes Absolute: 0.3 10*3/uL (ref 0.1–1.0)
Neutro Abs: 2.3 10*3/uL (ref 1.7–7.7)
Neutrophils Relative %: 40 % — ABNORMAL LOW (ref 43–77)
Platelets: 257 10*3/uL (ref 150–400)
RBC: 4.46 MIL/uL (ref 3.87–5.11)
RDW: 13 % (ref 11.5–15.5)
WBC: 5.7 10*3/uL (ref 4.0–10.5)

## 2014-07-19 LAB — COMPREHENSIVE METABOLIC PANEL
ALBUMIN: 3.6 g/dL (ref 3.5–5.2)
ALT: <5 U/L (ref 0–35)
ANION GAP: 11 (ref 5–15)
AST: 13 U/L (ref 0–37)
Alkaline Phosphatase: 52 U/L (ref 39–117)
BUN: 11 mg/dL (ref 6–23)
CALCIUM: 9.1 mg/dL (ref 8.4–10.5)
CO2: 25 mEq/L (ref 19–32)
Chloride: 106 mEq/L (ref 96–112)
Creatinine, Ser: 0.8 mg/dL (ref 0.50–1.10)
GFR calc non Af Amer: 90 mL/min — ABNORMAL LOW (ref >90)
GLUCOSE: 83 mg/dL (ref 70–99)
Potassium: 3.8 mEq/L (ref 3.7–5.3)
Sodium: 142 mEq/L (ref 137–147)
TOTAL PROTEIN: 7.4 g/dL (ref 6.0–8.3)
Total Bilirubin: 0.4 mg/dL (ref 0.3–1.2)

## 2014-07-19 LAB — URINALYSIS, ROUTINE W REFLEX MICROSCOPIC
Bilirubin Urine: NEGATIVE
Glucose, UA: NEGATIVE mg/dL
KETONES UR: NEGATIVE mg/dL
LEUKOCYTES UA: NEGATIVE
Nitrite: NEGATIVE
PROTEIN: NEGATIVE mg/dL
Specific Gravity, Urine: 1.006 (ref 1.005–1.030)
UROBILINOGEN UA: 0.2 mg/dL (ref 0.0–1.0)
pH: 6 (ref 5.0–8.0)

## 2014-07-19 LAB — URINE MICROSCOPIC-ADD ON

## 2014-07-19 LAB — POC URINE PREG, ED: Preg Test, Ur: NEGATIVE

## 2014-07-19 LAB — I-STAT TROPONIN, ED: Troponin i, poc: 0 ng/mL (ref 0.00–0.08)

## 2014-07-19 MED ORDER — KETOROLAC TROMETHAMINE 30 MG/ML IJ SOLN
30.0000 mg | Freq: Once | INTRAMUSCULAR | Status: AC
  Administered 2014-07-19: 30 mg via INTRAVENOUS
  Filled 2014-07-19: qty 1

## 2014-07-19 MED ORDER — METOCLOPRAMIDE HCL 5 MG/ML IJ SOLN
10.0000 mg | Freq: Once | INTRAMUSCULAR | Status: AC
  Administered 2014-07-19: 10 mg via INTRAVENOUS
  Filled 2014-07-19: qty 2

## 2014-07-19 MED ORDER — ACETAMINOPHEN 325 MG PO TABS
650.0000 mg | ORAL_TABLET | Freq: Once | ORAL | Status: AC
  Administered 2014-07-19: 650 mg via ORAL
  Filled 2014-07-19: qty 2

## 2014-07-19 NOTE — ED Provider Notes (Signed)
CSN: [id number]     Arrival date & time 07/19/14  1313 History   First MD Initiated Contact with Patient 07/19/14 1347     Chief Complaint  Patient presents with  . Tingling  . Anxiety     (Consider location/radiation/quality/duration/timing/severity/associated sxs/prior Treatment) HPI Comments: Patient is a 43 year old female with history of pancreatitis who presents to the emergency department today for evaluation of visual disturbance today. She reports that this happened while at work. She had "flashing spots" with associated gradually worsening headache. This lasted approximately 15 minutes and resolved spontaneously. She was at work when this occurred. A coworker gave her a glucometer to check her cbg and used his "EKG app". On the app her heart rate varied between 87-119 which concerned her. She continues to have a headache. On her way to the emergency department she developed 15 minutes of tingling to her arms. She reports she has been under a great deal of stress at work. She denies double vision, blurry vision, photophobia. No history of migraines.   Patient is a 43 y.o. female presenting with anxiety. The history is provided by the patient. No language interpreter was used.  Anxiety Associated symptoms include headaches. Pertinent negatives include no abdominal pain, chest pain, chills, fever, nausea or vomiting.    Past Medical History  Diagnosis Date  . GERD (gastroesophageal reflux disease)    Past Surgical History  Procedure Laterality Date  . Cholecystectomy N/A 05/22/2014    Procedure: LAPAROSCOPIC CHOLECYSTECTOMY WITH INTRAOPERATIVE CHOLANGIOGRAM;  Surgeon: Kandis Cockingavid H Newman, MD;  Location: [facility];  Service: General;  Laterality: N/A;  . Ercp N/A 05/23/2014    Procedure: ENDOSCOPIC RETROGRADE CHOLANGIOPANCREATOGRAPHY (ERCP);  Surgeon: Rachael Feeaniel P Jacobs, MD;  Location: [facility];  Service: Gastroenterology;  Laterality: N/A;   No family history on file. History  Substance Use  Topics  . Smoking status: Former Smoker    Quit date: 03/04/2000  . Smokeless tobacco: Never Used  . Alcohol Use: Yes     Comment: very rare   OB History   Grav Para Term Preterm Abortions TAB SAB Ect Mult Living                 Review of Systems  Constitutional: Negative for fever and chills.  Eyes: Positive for visual disturbance (flashing spots). Negative for photophobia.  Respiratory: Negative for shortness of breath.   Cardiovascular: Negative for chest pain.  Gastrointestinal: Negative for nausea, vomiting and abdominal pain.  Neurological: Positive for headaches. Negative for dizziness and light-headedness.       Tingling  All other systems reviewed and are negative.     Allergies  Codeine  Home Medications   Prior to Admission medications   Medication Sig Start Date End Date Taking? Authorizing Provider  acetaminophen (TYLENOL) 325 MG tablet Do not take more than 4000 mg of Tylenol (acetaminophen) in any 24 hour period.  This is in your prescribed pain medicine so you have to count it. 05/24/14  Yes Sherrie GeorgeWillard Jennings, PA-C  etonogestrel-ethinyl estradiol (NUVARING) 0.12-0.015 MG/24HR vaginal ring Place 1 each vaginally every 28 (twenty-eight) days. Insert vaginally and leave in place for 3 consecutive weeks, then remove for 1 week.   Yes Historical Provider, MD  ibuprofen (MOTRIN IB) 200 MG tablet You can take 2-3 tablets every 6 hours as needed. 05/24/14  Yes Sherrie GeorgeWillard Jennings, PA-C   BP 122/84  Pulse 83  Temp(Src) 98.2 F (36.8 C) (Oral)  Resp 20  SpO2 99%  LMP 07/19/2014 Physical Exam  Nursing note and vitals reviewed. Constitutional: She is oriented to person, place, and time. She appears well-developed and well-nourished. No distress.  Well appearing  HENT:  Head: Normocephalic and atraumatic.  Right Ear: External ear normal.  Left Ear: External ear normal.  Nose: Nose normal.  Mouth/Throat: Uvula is midline, oropharynx is clear and moist and mucous  membranes are normal.  Eyes: Conjunctivae and EOM are normal. Pupils are equal, round, and reactive to light.  Neck: Normal range of motion.  No nuchal rigidity or meningeal signs  Cardiovascular: Normal rate, regular rhythm, normal heart sounds, intact distal pulses and normal pulses.   Pulses:      Radial pulses are 2+ on the right side, and 2+ on the left side.       Posterior tibial pulses are 2+ on the right side, and 2+ on the left side.  Pulmonary/Chest: Effort normal and breath sounds normal. No stridor. No respiratory distress. She has no wheezes. She has no rales.  Abdominal: Soft. She exhibits no distension. There is no tenderness.  Musculoskeletal: Normal range of motion.  Strength 5/5 in all extremities. Moves all extremities without guarding or ataxia  Neurological: She is alert and oriented to person, place, and time. She has normal strength. No sensory deficit. GCS eye subscore is 4. GCS verbal subscore is 5. GCS motor subscore is 6.  Finger-nose-finger normal. Grip strength 5 out of 5 bilaterally. Normal gait.  Skin: Skin is warm and dry. She is not diaphoretic. No erythema.  Psychiatric: She has a normal mood and affect. Her behavior is normal.    ED Course  Procedures (including critical care time) Labs Review Labs Reviewed  CBC WITH DIFFERENTIAL - Abnormal; Notable for the following:    Neutrophils Relative % 40 (*)    Lymphocytes Relative 49 (*)    All other components within normal limits  COMPREHENSIVE METABOLIC PANEL - Abnormal; Notable for the following:    GFR calc non Af Amer 90 (*)    All other components within normal limits  URINALYSIS, ROUTINE W REFLEX MICROSCOPIC - Abnormal; Notable for the following:    Hgb urine dipstick LARGE (*)    All other components within normal limits  URINE MICROSCOPIC-ADD ON - Abnormal; Notable for the following:    Squamous Epithelial / LPF FEW (*)    All other components within normal limits  I-STAT TROPOININ, ED  POC  URINE PREG, ED    Imaging Review No results found.   EKG Interpretation None      MDM   Final diagnoses:  Acute nonintractable headache, unspecified headache type    Pt HA treated and improved while in ED. Patient's "flashing spots" are likely aura to migraine. Presentation is non concerning for [facility], ICH, Meningitis, or temporal arteritis. Pt is afebrile with no focal neuro deficits, nuchal rigidity, or change in vision. Pt is to follow up with PCP to discuss prophylactic medication. Pt verbalizes understanding and is agreeable with plan to dc. Dr. Patria Mane evaluated patient and agrees with plan.     Mora Bellman, PA-C 07/21/14 [phone]

## 2014-07-19 NOTE — ED Notes (Signed)
Pt walked back to room from triage w/o difficulty, gait steady.

## 2014-07-19 NOTE — Discharge Instructions (Signed)

## 2014-07-19 NOTE — ED Notes (Signed)
Pt reports episode "flashy spots" while at work today, assoc with HA. Had CBG, BP, HR checked. Sts normal except HR 87-119 on "EKG machine on coworkers phone". Sts also had some tingling in arms on the way to ED. Under a lot of stress at work. Sts she still has slightly blurred vision, HA.

## 2014-07-22 NOTE — ED Provider Notes (Signed)
Medical screening examination/treatment/procedure(s) were conducted as a shared visit with non-physician practitioner(s) and myself.  I personally evaluated the patient during the encounter.   EKG Interpretation   Date/Time:  Wednesday July 19 2014 15:38:06 EDT Ventricular Rate:  70 PR Interval:  147 QRS Duration: 100 QT Interval:  411 QTC Calculation: 443 R Axis:   71 Text Interpretation:  Sinus rhythm Low voltage, precordial leads No  significant change was found Reconfirmed by Bijal Siglin  MD, Annabeth Tortora ([id number]) on  07/22/2014 7:08:50 AM      Well appearing. Sounds like scotoma prior to migraine. Improvement in symptoms  Lyanne CoKevin M Izaak Sahr, MD 07/22/14 [phone]

## 2014-09-22 ENCOUNTER — Telehealth: Payer: Self-pay

## 2014-09-22 NOTE — Telephone Encounter (Signed)
LMVM reminding patient to get her flu shot. 

## 2014-10-03 ENCOUNTER — Ambulatory Visit (INDEPENDENT_AMBULATORY_CARE_PROVIDER_SITE_OTHER): Payer: BC Managed Care – PPO | Admitting: Emergency Medicine

## 2014-10-03 VITALS — BP 124/82 | HR 99 | Temp 99.9°F | Resp 17 | Ht 65.5 in | Wt 162.0 lb

## 2014-10-03 DIAGNOSIS — N3941 Urge incontinence: Secondary | ICD-10-CM

## 2014-10-03 DIAGNOSIS — N3090 Cystitis, unspecified without hematuria: Secondary | ICD-10-CM

## 2014-10-03 LAB — POCT URINALYSIS DIPSTICK
Bilirubin, UA: NEGATIVE
Glucose, UA: NEGATIVE
Ketones, UA: NEGATIVE
Leukocytes, UA: NEGATIVE
Nitrite, UA: NEGATIVE
PH UA: 7
Protein, UA: NEGATIVE
SPEC GRAV UA: 1.015
UROBILINOGEN UA: 1

## 2014-10-03 LAB — POCT UA - MICROSCOPIC ONLY
CASTS, UR, LPF, POC: NEGATIVE
CRYSTALS, UR, HPF, POC: NEGATIVE
MUCUS UA: NEGATIVE
Yeast, UA: NEGATIVE

## 2014-10-03 MED ORDER — CIPROFLOXACIN HCL 500 MG PO TABS
500.0000 mg | ORAL_TABLET | Freq: Two times a day (BID) | ORAL | Status: DC
Start: 2014-10-03 — End: 2014-12-20

## 2014-10-03 MED ORDER — PHENAZOPYRIDINE HCL 200 MG PO TABS: 200.0000 mg | ORAL_TABLET | Freq: Three times a day (TID) | ORAL | Status: DC | PRN

## 2014-10-03 NOTE — Progress Notes (Signed)
[facility] [address], [place] [id number] [phone]  Date:  10/03/2014   Name:  Sandy Gilbert   DOB:  Apr 05, 1971   MRN:  [id number]  PCP:  Nilda SimmerSMITH,KRISTI, MD    Chief Complaint: Abdominal Pain and Other   History of Present Illness:  Sandy Gilbert is a 43 y.o. very pleasant female patient who presents with the following:  Patient has a three day history of urgency, frequency and urge incontinence.   No dysuria Has low back pain and pelvic discomfort. No discharge or bleeding.  No dyspareunia No nausea or vomiting.  No stool change No improvement with over the counter medications or other home remedies.  Denies other complaint or health concern today.   Patient Active Problem List   Diagnosis Date Noted  . Obstruction of bile duct 05/22/2014  . Nonspecific (abnormal) findings on radiological and other examination of biliary tract 05/22/2014  . Pancreatitis 05/17/2014  . Gallstones 05/17/2014    Past Medical History  Diagnosis Date  . GERD (gastroesophageal reflux disease)     Past Surgical History  Procedure Laterality Date  . Cholecystectomy N/A 05/22/2014    Procedure: LAPAROSCOPIC CHOLECYSTECTOMY WITH INTRAOPERATIVE CHOLANGIOGRAM;  Surgeon: Kandis Cockingavid H Newman, MD;  Location: [facility];  Service: General;  Laterality: N/A;  . Ercp N/A 05/23/2014    Procedure: ENDOSCOPIC RETROGRADE CHOLANGIOPANCREATOGRAPHY (ERCP);  Surgeon: Rachael Feeaniel P Jacobs, MD;  Location: [facility];  Service: Gastroenterology;  Laterality: N/A;    History  Substance Use Topics  . Smoking status: Former Smoker    Quit date: 03/04/2000  . Smokeless tobacco: Never Used  . Alcohol Use: Yes     Comment: very rare    No family history on file.  Allergies  Allergen Reactions  . Codeine     Headache.     Medication list has been reviewed and updated.  Current Outpatient Prescriptions on File Prior to Visit  Medication Sig Dispense Refill  . acetaminophen (TYLENOL) 325  MG tablet Do not take more than 4000 mg of Tylenol (acetaminophen) in any 24 hour period.  This is in your prescribed pain medicine so you have to count it.    Marland Kitchen. etonogestrel-ethinyl estradiol (NUVARING) 0.12-0.015 MG/24HR vaginal ring Place 1 each vaginally every 28 (twenty-eight) days. Insert vaginally and leave in place for 3 consecutive weeks, then remove for 1 week.    Marland Kitchen. ibuprofen (MOTRIN IB) 200 MG tablet You can take 2-3 tablets every 6 hours as needed. 30 tablet 0   No current facility-administered medications on file prior to visit.    Review of Systems:  As per HPI, otherwise negative.    Physical Examination: Filed Vitals:   10/03/14 2039  BP: 124/82  Pulse: 99  Temp: 99.9 F (37.7 C)  Resp: 17   Filed Vitals:   10/03/14 2039  Height: 5' 5.5" (1.664 m)  Weight: 162 lb (73.483 kg)   Body mass index is 26.54 kg/(m^2). Ideal Body Weight: Weight in (lb) to have BMI = 25: 152.2   GEN: WDWN, NAD, Non-toxic, Alert & Oriented x 3 HEENT: Atraumatic, Normocephalic.  Ears and Nose: No external deformity. EXTR: No clubbing/cyanosis/edema NEURO: Normal gait.  PSYCH: Normally interactive. Conversant. Not depressed or anxious appearing.  Calm demeanor.  ABD Soft and mild suprapubic tender.  Mild cva tenderness  Assessment and Plan: Incontinence  Signed,  Phillips OdorJeffery Kamber Vignola, MD   Results for orders placed or performed in visit on 10/03/14  POCT UA -  Microscopic Only  Result Value Ref Range   WBC, Ur, HPF, POC 0-2    RBC, urine, microscopic 1-3    Bacteria, U Microscopic trace    Mucus, UA neg    Epithelial cells, urine per micros 0-2    Crystals, Ur, HPF, POC neg    Casts, Ur, LPF, POC neg    Yeast, UA neg   POCT urinalysis dipstick  Result Value Ref Range   Color, UA yellow    Clarity, UA clear    Glucose, UA neg    Bilirubin, UA neg    Ketones, UA neg    Spec Grav, UA 1.015    Blood, UA trace    pH, UA 7.0    Protein, UA neg    Urobilinogen, UA 1.0     Nitrite, UA neg    Leukocytes, UA Negative

## 2014-10-03 NOTE — Patient Instructions (Signed)
Urinary Incontinence Urinary incontinence is the involuntary loss of urine from your bladder. CAUSES  There are many causes of urinary incontinence. They include:  Medicines.  Infections.  Prostatic enlargement, leading to overflow of urine from your bladder.  Surgery.  Neurological diseases.  Emotional factors. SIGNS AND SYMPTOMS Urinary Incontinence can be divided into four types: 1. Urge incontinence. Urge incontinence is the involuntary loss of urine before you have the opportunity to go to the bathroom. There is a sudden urge to void but not enough time to reach a bathroom. 2. Stress incontinence. Stress incontinence is the sudden loss of urine with any activity that forces urine to pass. It is commonly caused by anatomical changes to the pelvis and sphincter areas of your body. 3. Overflow incontinence. Overflow incontinence is the loss of urine from an obstructed opening to your bladder. This results in a backup of urine and a resultant buildup of pressure within the bladder. When the pressure within the bladder exceeds the closing pressure of the sphincter, the urine overflows, which causes incontinence, similar to water overflowing a dam. 4. Total incontinence. Total incontinence is the loss of urine as a result of the inability to store urine within your bladder. DIAGNOSIS  Evaluating the cause of incontinence may require:  A thorough and complete medical and obstetric history.  A complete physical exam.  Laboratory tests such as a urine culture and sensitivities. When additional tests are indicated, they can include:  An ultrasound exam.  Kidney and bladder X-rays.  Cystoscopy. This is an exam of the bladder using a narrow scope.  Urodynamic testing to test the nerve function to the bladder and sphincter areas. TREATMENT  Treatment for urinary incontinence depends on the cause:  For urge incontinence caused by a bacterial infection, antibiotics will be prescribed.  If the urge incontinence is related to medicines you take, your health care provider may have you change the medicine.  For stress incontinence, surgery to re-establish anatomical support to the bladder or sphincter, or both, will often correct the condition.  For overflow incontinence caused by an enlarged prostate, an operation to open the channel through the enlarged prostate will allow the flow of urine out of the bladder. In women with fibroids, a hysterectomy may be recommended.  For total incontinence, surgery on your urinary sphincter may help. An artificial urinary sphincter (an inflatable cuff placed around the urethra) may be required. In women who have developed a hole-like passage between their bladder and vagina (vesicovaginal fistula), surgery to close the fistula often is required. HOME CARE INSTRUCTIONS  Normal daily hygiene and the use of pads or adult diapers that are changed regularly will help prevent odors and skin damage.  Avoid caffeine. It can overstimulate your bladder.  Use the bathroom regularly. Try about every 2-3 hours to go to the bathroom, even if you do not feel the need to do so. Take time to empty your bladder completely. After urinating, wait a minute. Then try to urinate again.  For causes involving nerve dysfunction, keep a log of the medicines you take and a journal of the times you go to the bathroom. SEEK MEDICAL CARE IF:  You experience worsening of pain instead of improvement in pain after your procedure.  Your incontinence becomes worse instead of better. SEE IMMEDIATE MEDICAL CARE IF:  You experience fever or shaking chills.  You are unable to pass your urine.  You have redness spreading into your groin or down into your thighs. MAKE SURE   YOU:   Understand these instructions.   Will watch your condition.  Will get help right away if you are not doing well or get worse. Document Released: 11/06/2004 Document Revised: 07/20/2013 Document  Reviewed: 03/08/2013 Exit[employer] Patient Information 2015 [employer]. This information is not intended to replace advice given to you by your health care provider. Make sure you discuss any questions you have with your health care provider.  

## 2014-12-20 ENCOUNTER — Ambulatory Visit (INDEPENDENT_AMBULATORY_CARE_PROVIDER_SITE_OTHER): Payer: BLUE CROSS/BLUE SHIELD | Admitting: Family Medicine

## 2014-12-20 VITALS — BP 98/72 | HR 86 | Temp 98.0°F | Resp 18 | Ht 65.75 in | Wt 160.0 lb

## 2014-12-20 DIAGNOSIS — R350 Frequency of micturition: Secondary | ICD-10-CM

## 2014-12-20 DIAGNOSIS — R300 Dysuria: Secondary | ICD-10-CM

## 2014-12-20 DIAGNOSIS — N3000 Acute cystitis without hematuria: Secondary | ICD-10-CM | POA: Diagnosis not present

## 2014-12-20 LAB — POCT URINALYSIS DIPSTICK
BILIRUBIN UA: NEGATIVE
Glucose, UA: NEGATIVE
Ketones, UA: NEGATIVE
Nitrite, UA: NEGATIVE
PROTEIN UA: 100
Spec Grav, UA: >=1.03
Urobilinogen, UA: 0.2
pH, UA: 5.5

## 2014-12-20 LAB — POCT UA - MICROSCOPIC ONLY
CASTS, UR, LPF, POC: NEGATIVE
Crystals, Ur, HPF, POC: NEGATIVE
Mucus, UA: POSITIVE
Yeast, UA: NEGATIVE

## 2014-12-20 MED ORDER — PHENAZOPYRIDINE HCL 200 MG PO TABS: 200.0000 mg | ORAL_TABLET | Freq: Three times a day (TID) | ORAL | Status: DC | PRN

## 2014-12-20 MED ORDER — SULFAMETHOXAZOLE-TRIMETHOPRIM 800-160 MG PO TABS
1.0000 | ORAL_TABLET | Freq: Two times a day (BID) | ORAL | Status: DC
Start: 2014-12-20 — End: 2015-05-19

## 2014-12-20 NOTE — Progress Notes (Signed)
Subjective: 44 year old lady who has had dysuria today. She's not had a fever. She was fine yesterday. She had a urinary tract infection late last year.  Objective: No acute distress. No CVA tenderness. Without mass tenderness. She did have mild nausea.   Results for orders placed or performed in visit on 12/20/14  POCT UA - Microscopic Only  Result Value Ref Range   WBC, Ur, HPF, POC TNTC    RBC, urine, microscopic 7-19    Bacteria, U Microscopic trace    Mucus, UA positive    Epithelial cells, urine per micros 1-4    Crystals, Ur, HPF, POC negative    Casts, Ur, LPF, POC negative    Yeast, UA negative   POCT urinalysis dipstick  Result Value Ref Range   Color, UA yellow    Clarity, UA cloudy    Glucose, UA negative    Bilirubin, UA negative    Ketones, UA negative    Spec Grav, UA >=1.030    Blood, UA large    pH, UA 5.5    Protein, UA 100    Urobilinogen, UA 0.2    Nitrite, UA negative    Leukocytes, UA moderate (2+)    Assessment: Acute cystitis  Plan: Treat with Bactrim DS and Pyridium  Return if needed

## 2014-12-20 NOTE — Patient Instructions (Signed)
Drink plenty of fluids  Sulfamethoxazole one twice daily with breakfast and supper for 5-7 days  Take Pyridium 1 pill 3 times daily as needed for urinary pain  Return if worse or further concerns

## 2014-12-22 LAB — URINE CULTURE

## 2015-02-14 IMAGING — US US ABDOMEN LIMITED
1 series · 13 of 25 positions shown · non-contrast
Comparison: No priors.

CLINICAL DATA: Epigastric abdominal pain.

EXAM:
US ABDOMEN LIMITED - RIGHT UPPER QUADRANT

[Series 1: us abdomen limited · 0.21mm/px · 13 of 49 slices shown]
[im 1/49]
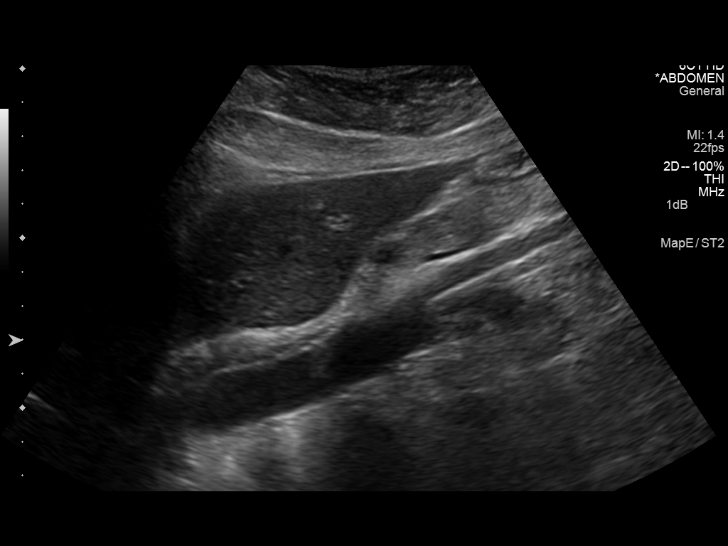
[im 5/49]
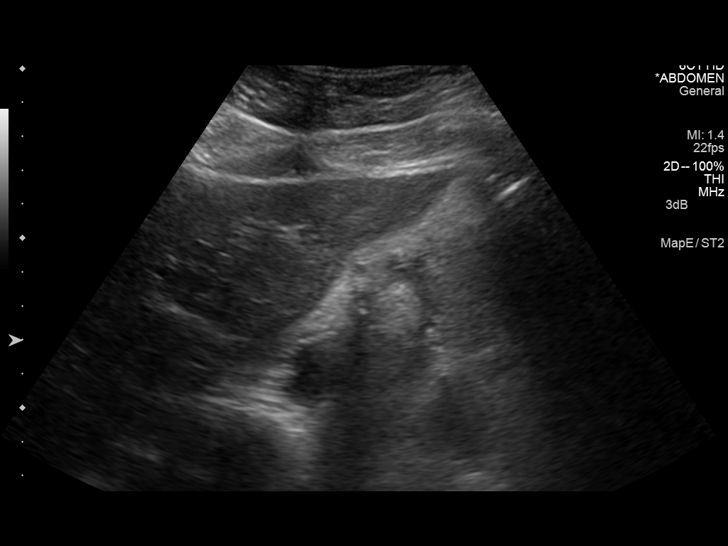
[im 9/49]
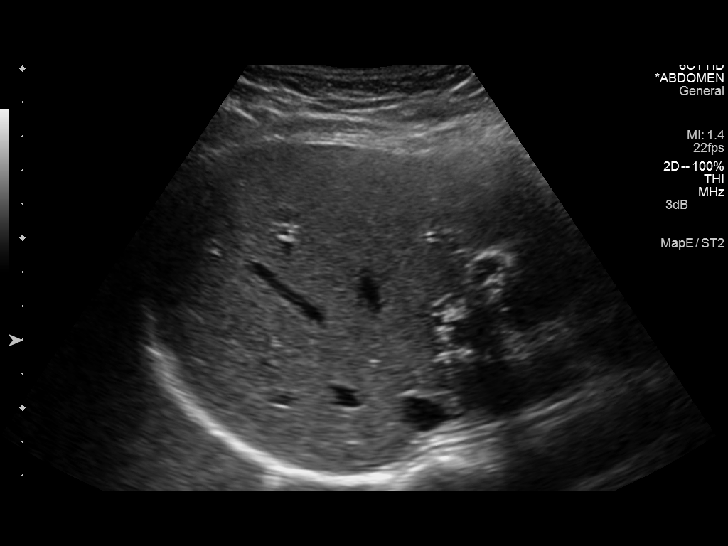
[im 13/49]
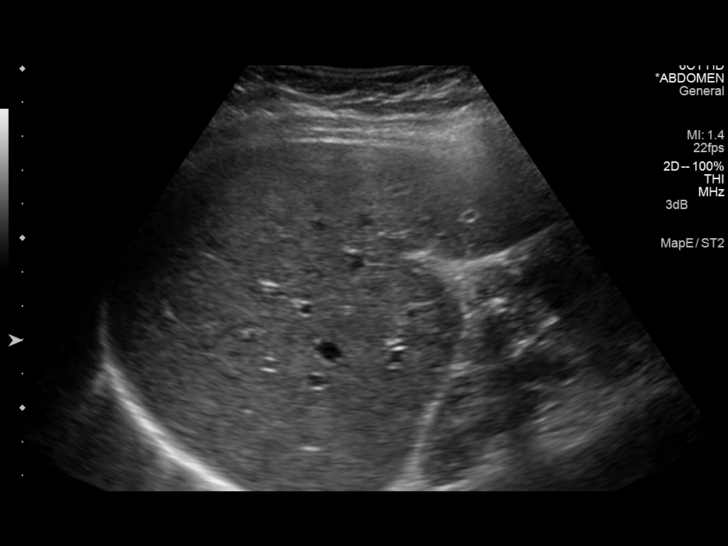
[im 17/49]
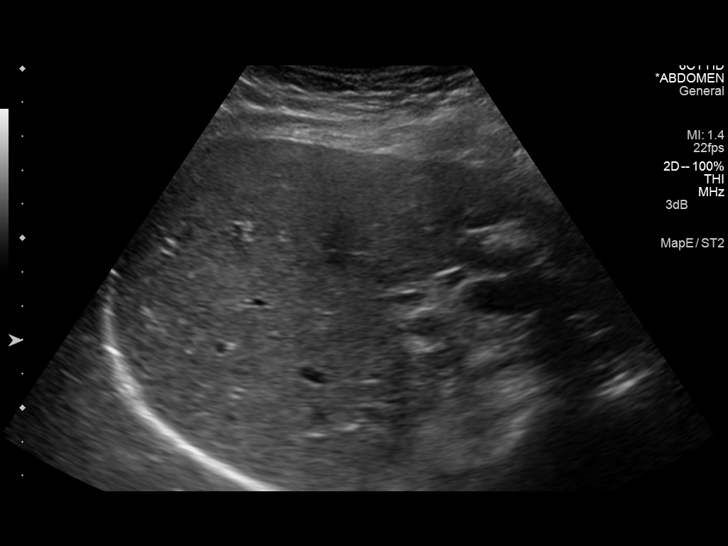
[im 21/49]
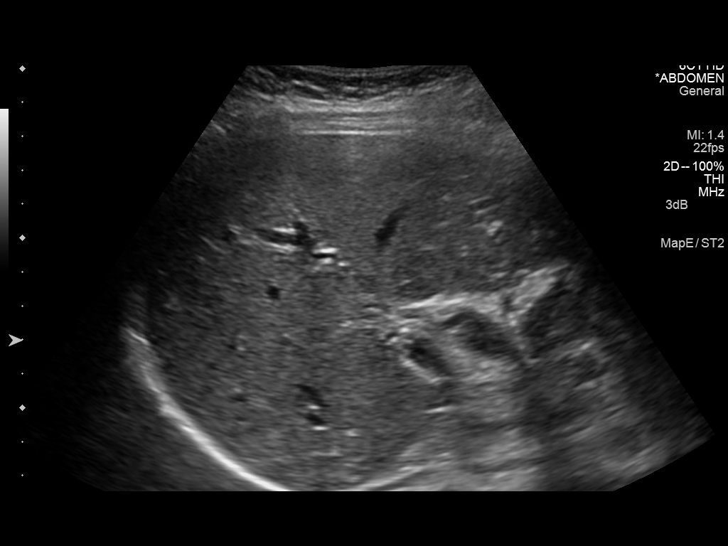
[im 25/49]
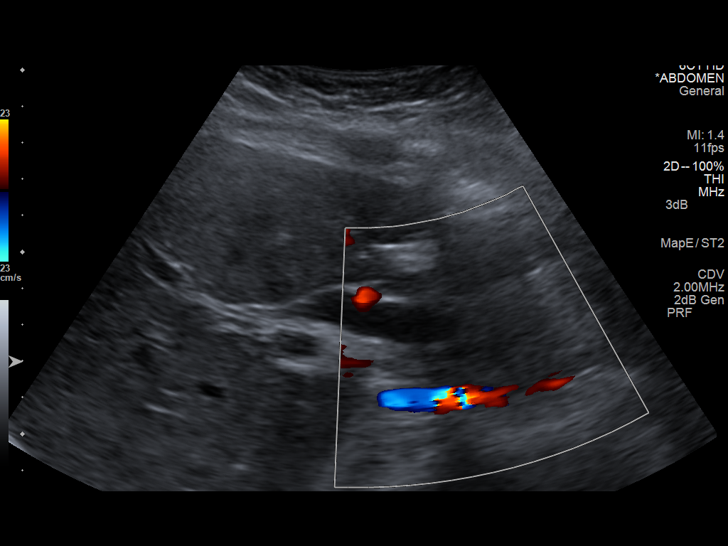
[im 29/49]
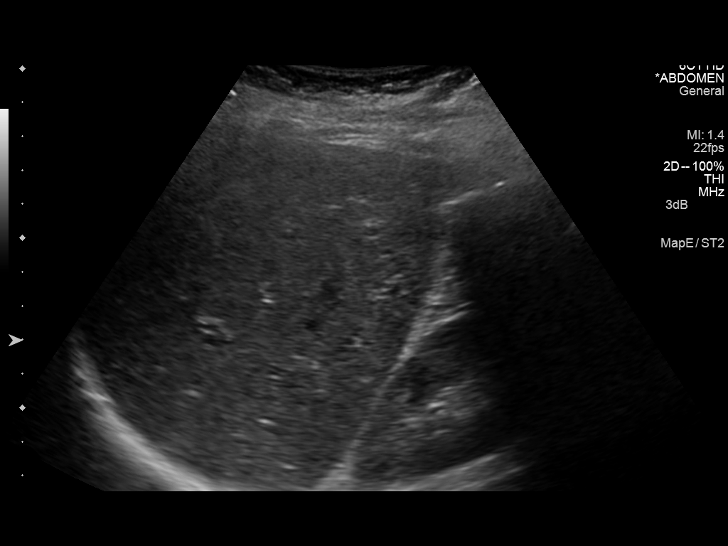
[im 33/49]
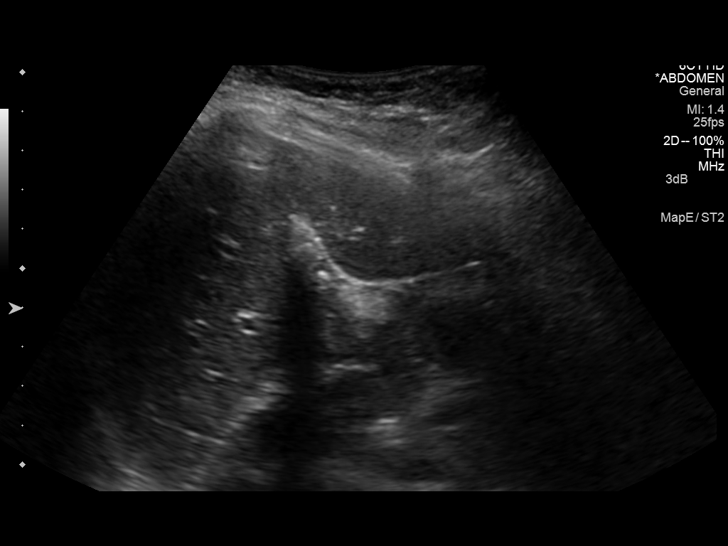
[im 37/49]
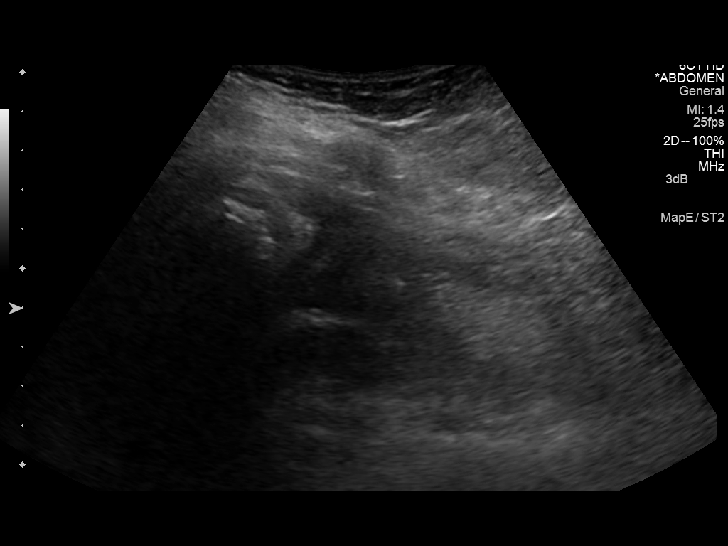
[im 41/49]
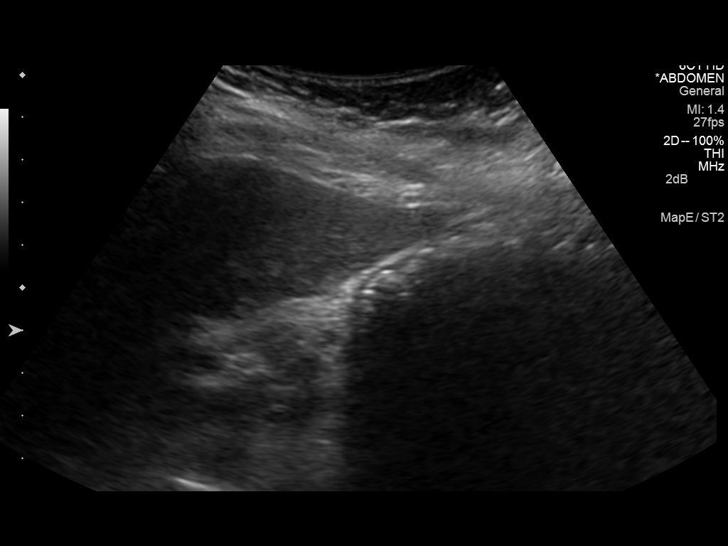
[im 45/49]
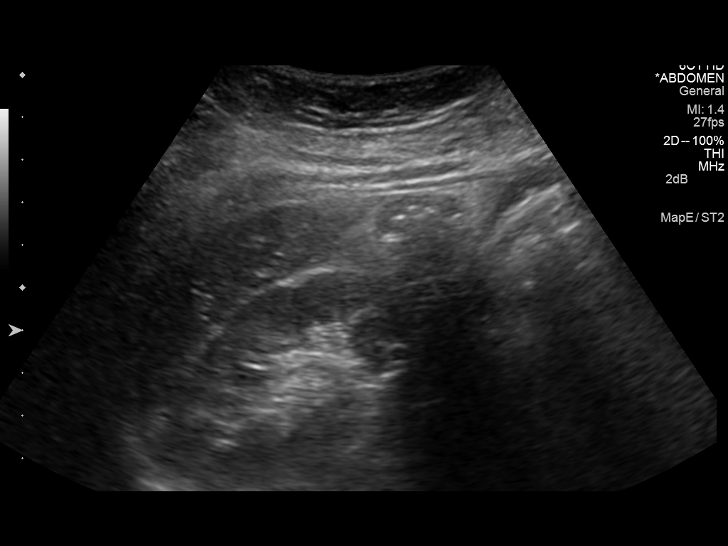
[im 49/49]
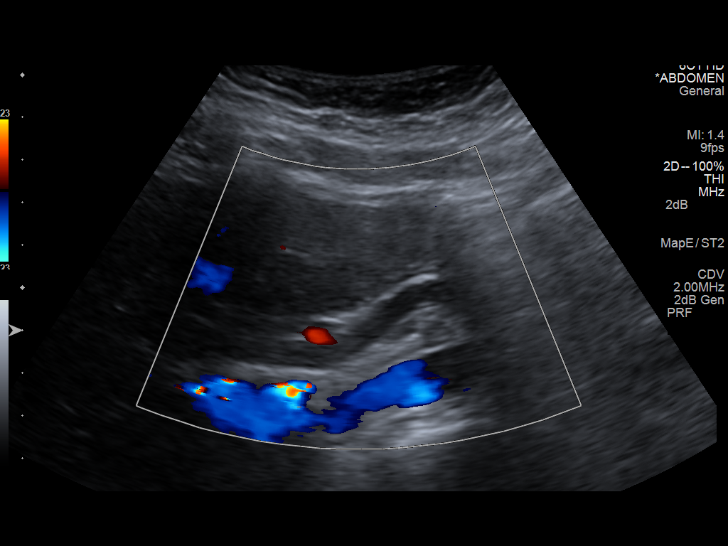

[13 of 25 positions shown; findings below may reference images not displayed]

FINDINGS: Gallbladder:

The gallbladder is nearly completely contracted, and filled with
multiple echogenic structures with posterior acoustic shadowing,
compatible with gallstones. Gallbladder wall thickness is difficult
to judge, but appears within normal limits, likely 2 mm. No definite
pericholecystic fluid. Per report from the sonographer, the patient
did not exhibit a sonographic Murphy's sign on examination.

Common bile duct:

Diameter: 9.3 mm in the porta hepatis.

Liver:

No focal lesion identified. Within normal limits in parenchymal
echogenicity. Probable mild intrahepatic biliary ductal dilatation.
IMPRESSION: 1. Mild intra and extrahepatic biliary ductal dilatation.
2. Cholelithiasis. No definite findings to suggest acute
cholecystitis at this time.
3. However, given the presence of stones in the gallbladder, the
possibility of a distal stone in the common bile duct is of concern,
particularly in light of the biliary tract dilatation. Clinical
correlation for signs and symptoms of biliary tract obstruction is
recommended. Further evaluation with MRCP may be appropriate if
clinically indicated.

## 2015-02-19 IMAGING — RF DG CHOLANGIOGRAM OPERATIVE
1 series · 11 of 11 positions shown · non-contrast
Comparison: None.

CLINICAL DATA: Gallstones

EXAM:
INTRAOPERATIVE CHOLANGIOGRAM
TECHNIQUE: Cholangiographic images from the C-arm fluoroscopic device were
submitted for interpretation post-operatively. Please see the
procedural report for the amount of contrast and the fluoroscopy
time utilized.

[Series 1: run · 3 acquisitions, 11 frames shown]
[im 1/3]
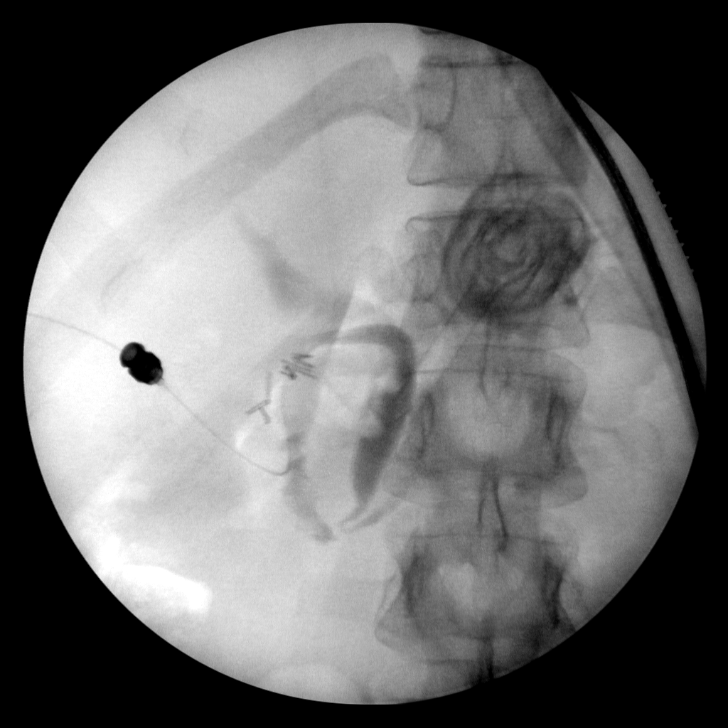
[im 1/3]
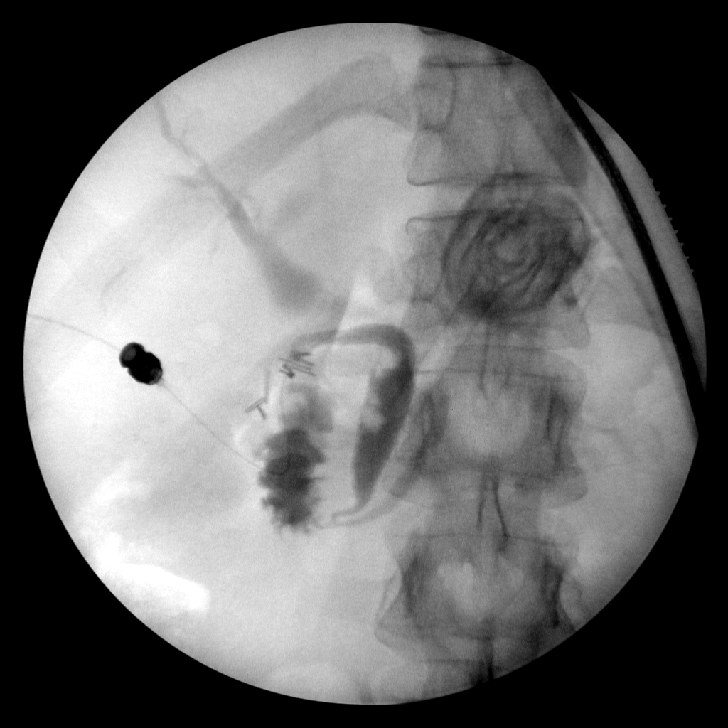
[im 1/3]
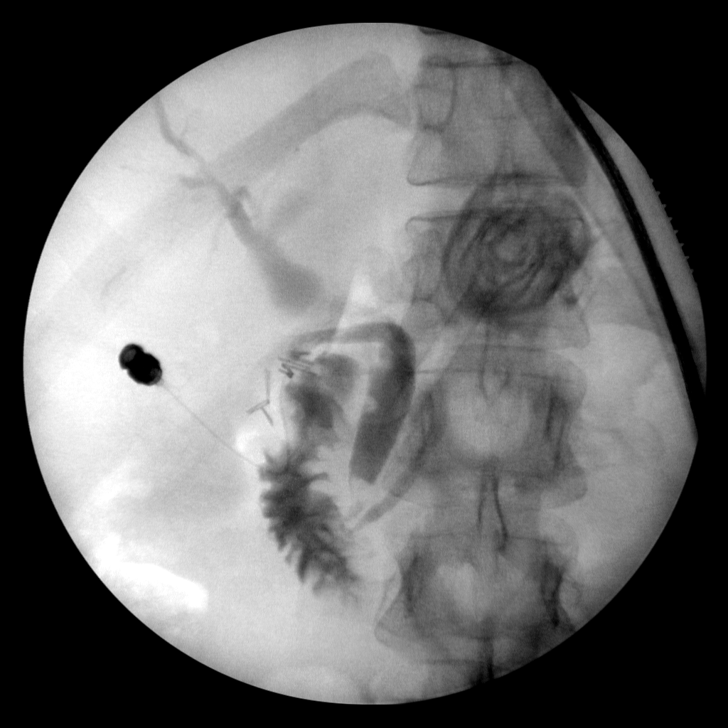
[im 1/3]
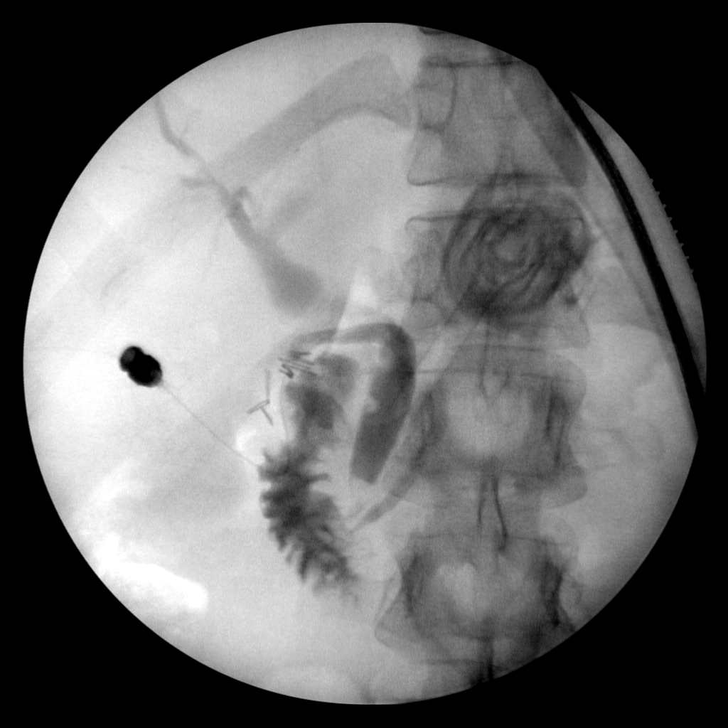
[im 2/3]
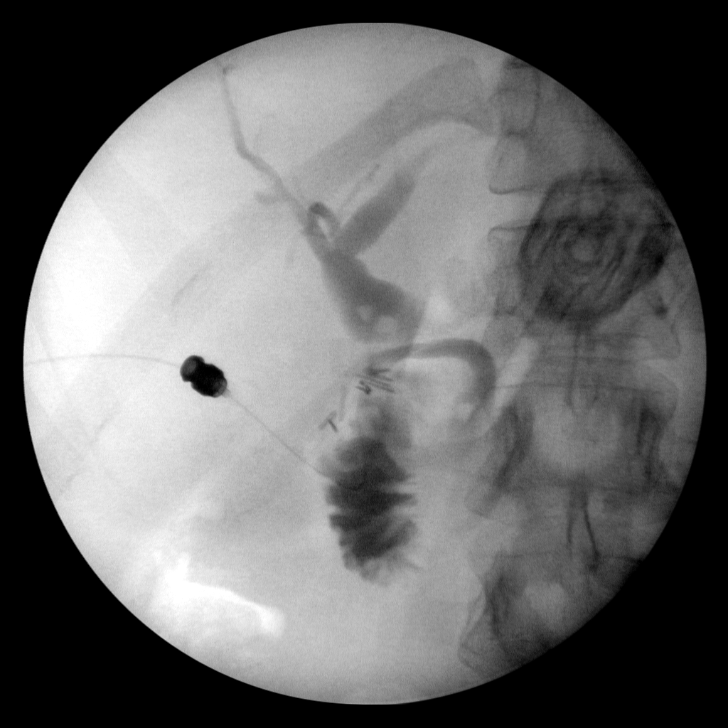
[im 2/3]
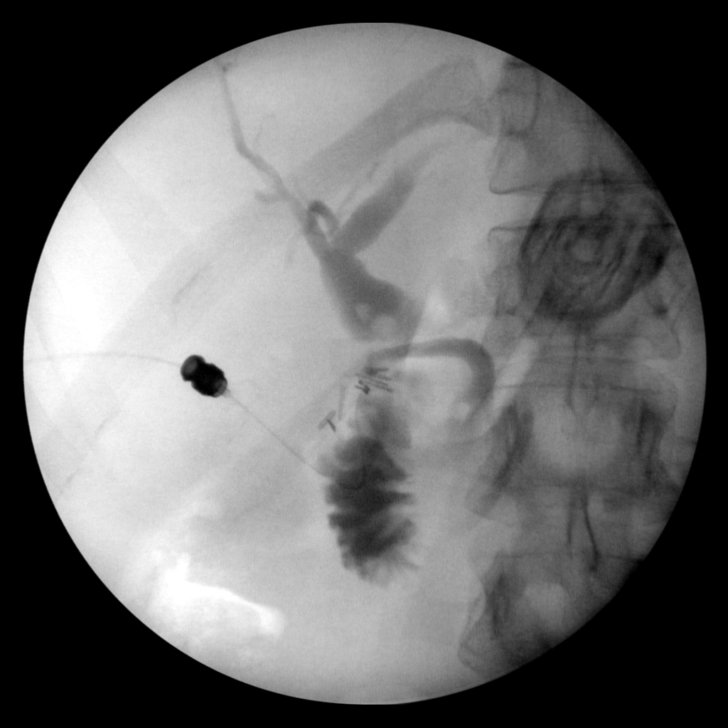
[im 2/3]
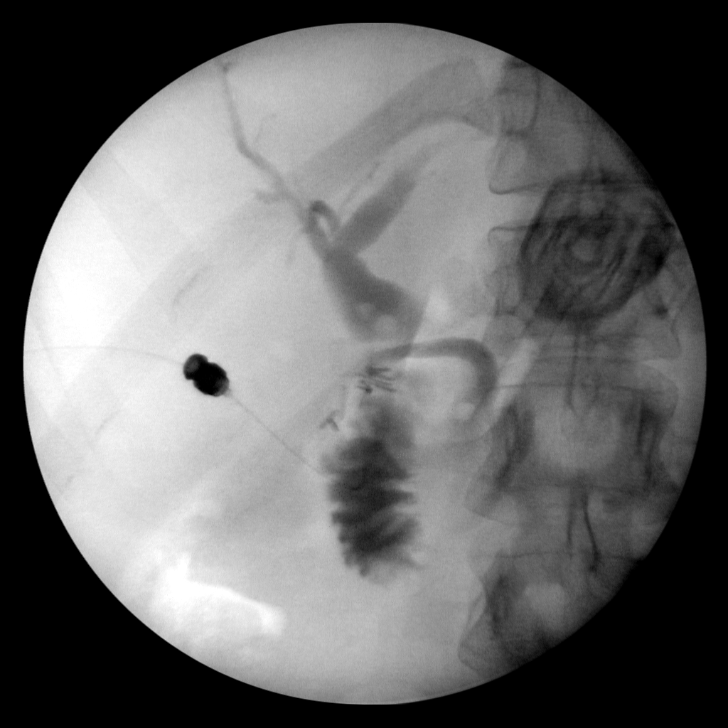
[im 3/3]
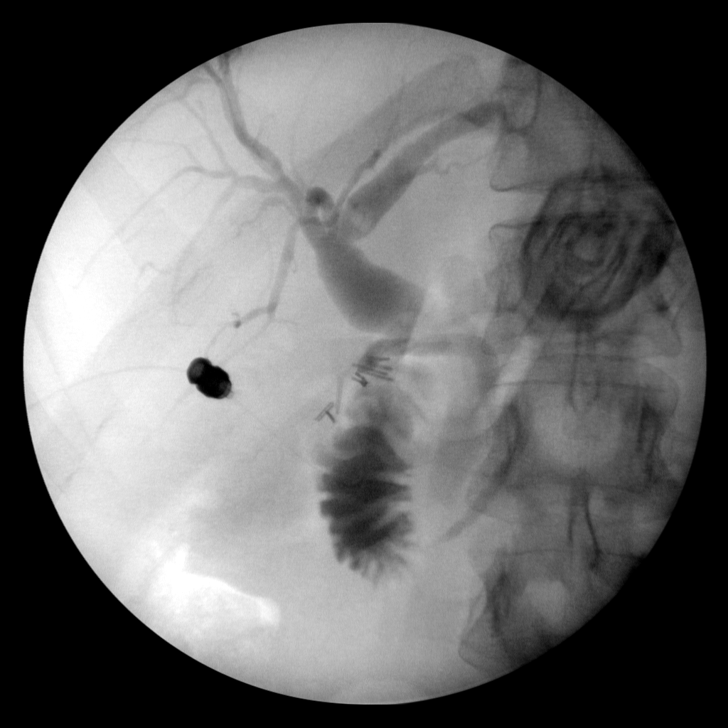
[im 3/3]
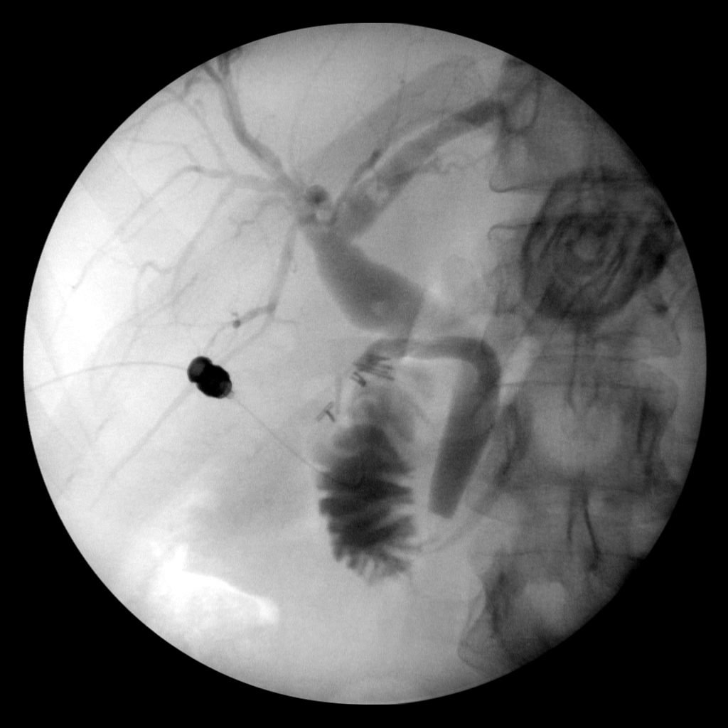
[im 3/3]
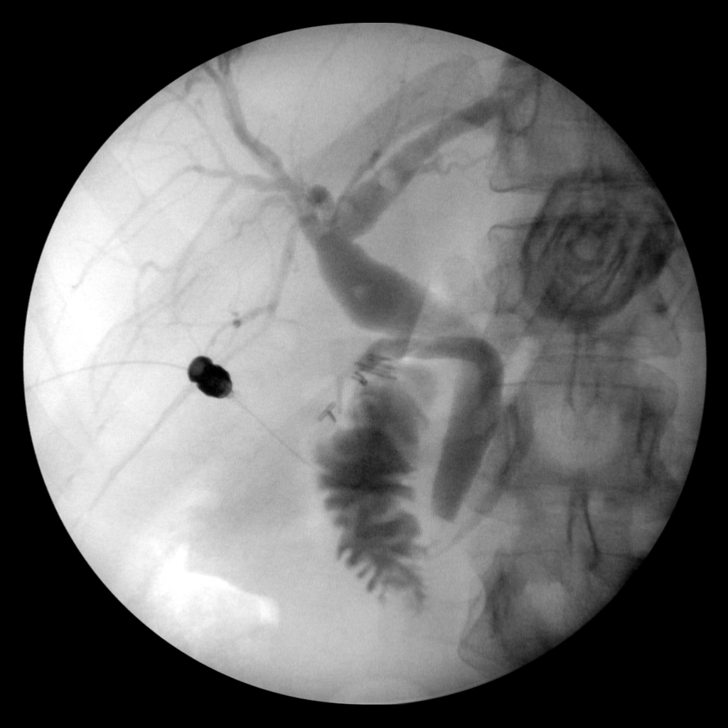
[im 3/3]
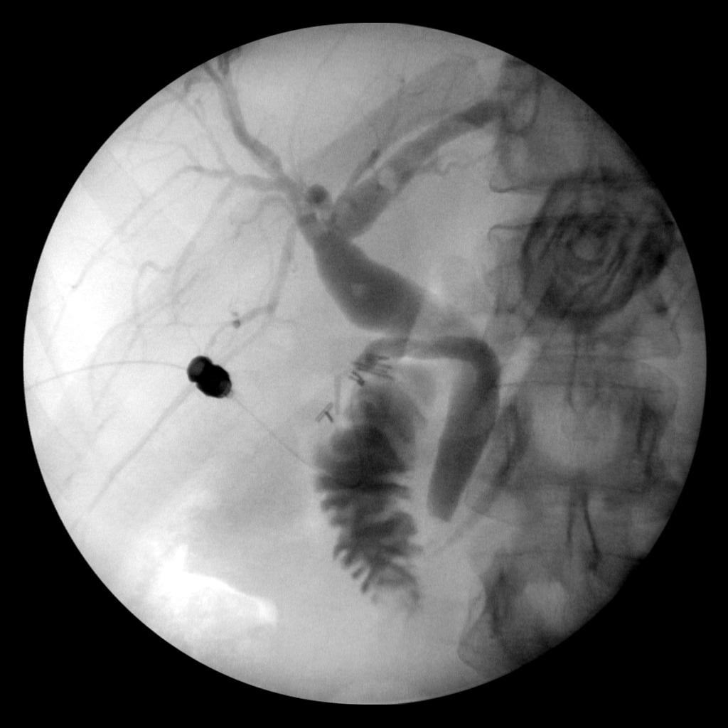

[11 of 11 positions shown; findings below may reference images not displayed]

FINDINGS: Contrast fills the biliary tree and duodenum. The common bile duct
is dilated. Several common duct stones are present.
IMPRESSION: Patent biliary tree.  Several common duct stones are present.

## 2015-02-20 IMAGING — RF DG ERCP WO/W SPHINCTEROTOMY
1 series · 5 of 5 positions shown · non-contrast
Comparison: Intraoperative cholangiogram on 05/22/2014

CLINICAL DATA: Choledocholithiasis.

EXAM:
ERCP
TECHNIQUE: Multiple spot images obtained with the fluoroscopic device and
submitted for interpretation post-procedure.

[Series 1: run · 5 of 5 slices shown]
[im 1/5]
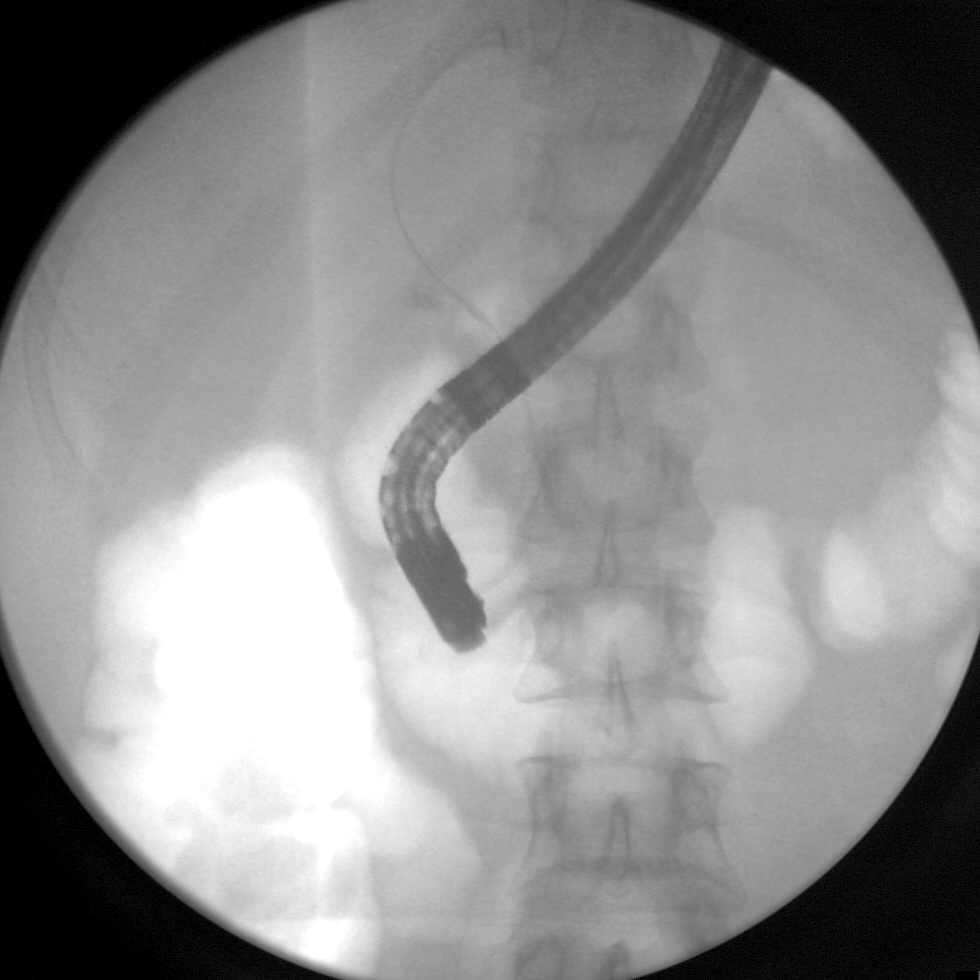
[im 2/5]
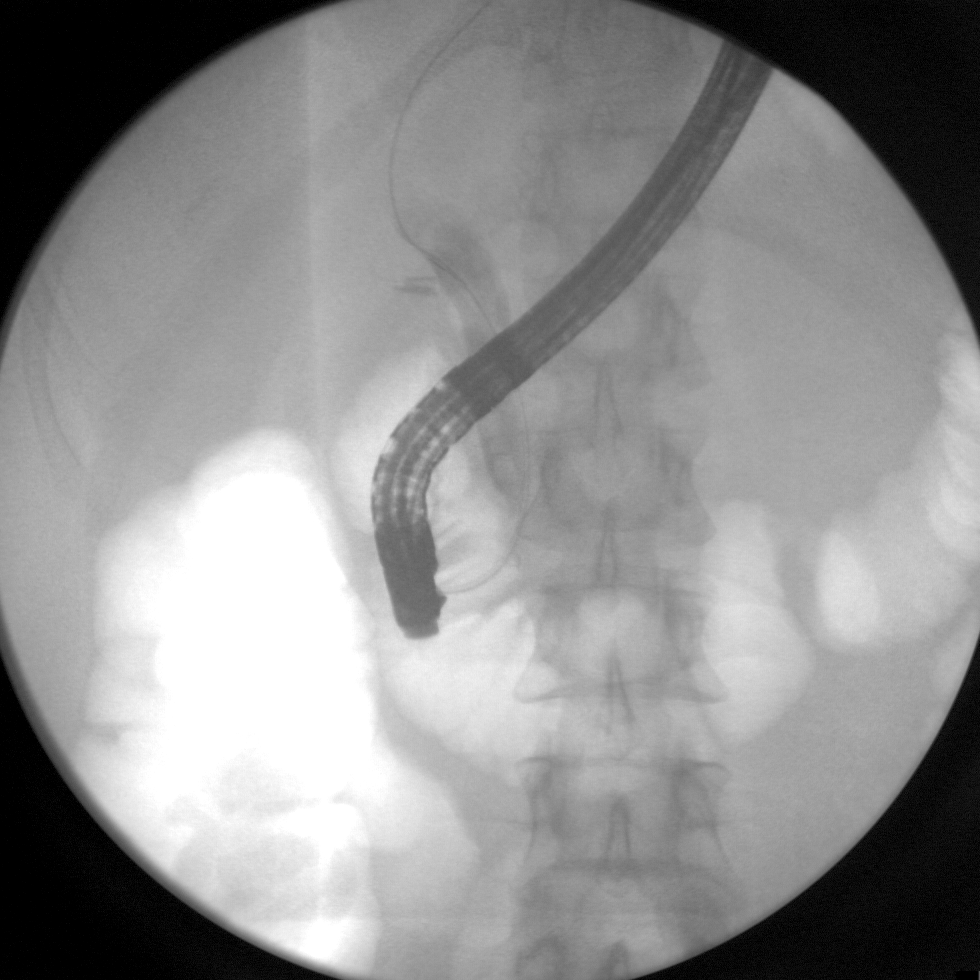
[im 3/5]
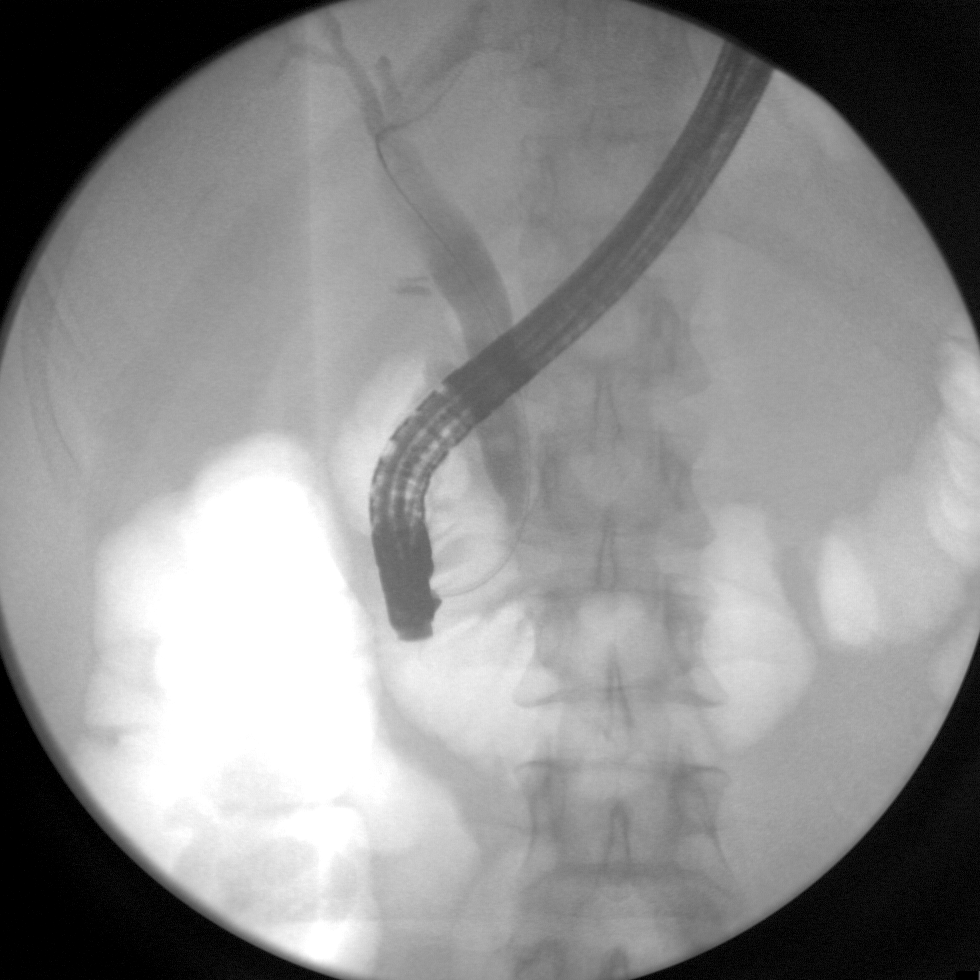
[im 4/5]
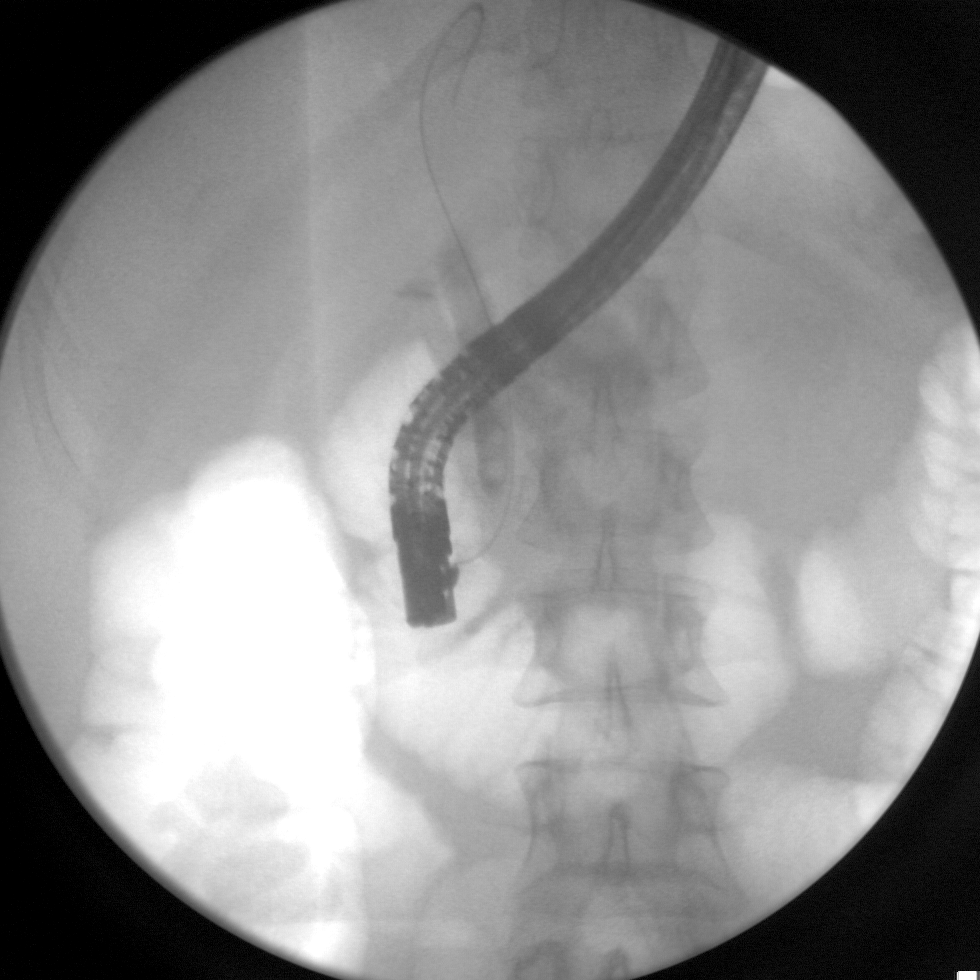
[im 5/5]
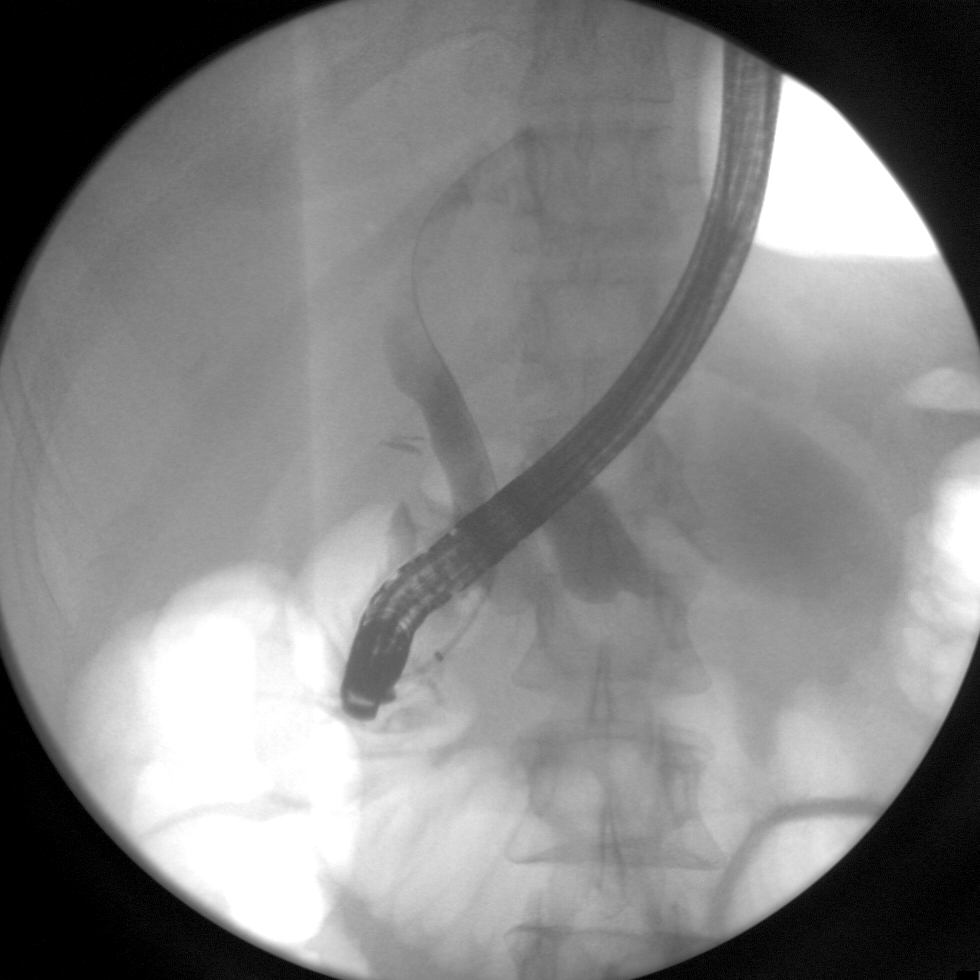

[5 of 5 positions shown; findings below may reference images not displayed]

FINDINGS: Imaging obtained during ERCP demonstrates endoscopic cannulation of
the common bile duct. Cholangiogram shows multiple rounded filling
defects in a dilated common bile duct. Balloon sweep maneuver was
performed.
IMPRESSION: Choledocholithiasis with common bile duct stone extraction.

These images were submitted for radiologic interpretation only.
Please see the procedural report for the amount of contrast and the
fluoroscopy time utilized.

## 2015-05-11 ENCOUNTER — Emergency Department (HOSPITAL_COMMUNITY)
Admission: EM | Admit: 2015-05-11 | Discharge: 2015-05-11 | Disposition: A | Discharge status: Home / Self Care | Payer: BLUE CROSS/BLUE SHIELD | Source: Home / Self Care | Attending: Family Medicine | Admitting: Family Medicine

## 2015-05-11 ENCOUNTER — Encounter (HOSPITAL_COMMUNITY): Payer: Self-pay | Admitting: Emergency Medicine

## 2015-05-11 DIAGNOSIS — N390 Urinary tract infection, site not specified: Secondary | ICD-10-CM | POA: Diagnosis not present

## 2015-05-11 LAB — POCT URINALYSIS DIP (DEVICE)
Bilirubin Urine: NEGATIVE
Glucose, UA: NEGATIVE mg/dL
KETONES UR: NEGATIVE mg/dL
Nitrite: NEGATIVE
Protein, ur: NEGATIVE mg/dL
SPECIFIC GRAVITY, URINE: 1.01 (ref 1.005–1.030)
Urobilinogen, UA: 0.2 mg/dL (ref 0.0–1.0)
pH: 5.5 (ref 5.0–8.0)

## 2015-05-11 MED ORDER — CEPHALEXIN 500 MG PO CAPS: 500.0000 mg | ORAL_CAPSULE | Freq: Four times a day (QID) | ORAL | Status: DC

## 2015-05-11 NOTE — Discharge Instructions (Signed)
Take all of medicine as directed, drink lots of fluids, see your doctor if further problems. °

## 2015-05-11 NOTE — ED Provider Notes (Signed)
CSN: [id number]     Arrival date & time 05/11/15  1928 History   First MD Initiated Contact with Patient 05/11/15 2023     Chief Complaint  Patient presents with  . Abdominal Pain   (Consider location/radiation/quality/duration/timing/severity/associated sxs/prior Treatment) Patient is a 44 y.o. female presenting with frequency. The history is provided by the patient.  Urinary Frequency This is a new problem. The current episode started 2 days ago. The problem has been gradually worsening. Pertinent negatives include no chest pain and no abdominal pain.    Past Medical History  Diagnosis Date  . GERD (gastroesophageal reflux disease)    Past Surgical History  Procedure Laterality Date  . Cholecystectomy N/A 05/22/2014    Procedure: LAPAROSCOPIC CHOLECYSTECTOMY WITH INTRAOPERATIVE CHOLANGIOGRAM;  Surgeon: Kandis Cocking, MD;  Location: [facility];  Service: General;  Laterality: N/A;  . Ercp N/A 05/23/2014    Procedure: ENDOSCOPIC RETROGRADE CHOLANGIOPANCREATOGRAPHY (ERCP);  Surgeon: Rachael Fee, MD;  Location: [facility];  Service: Gastroenterology;  Laterality: N/A;   No family history on file. History  Substance Use Topics  . Smoking status: Former Smoker    Quit date: 03/04/2000  . Smokeless tobacco: Never Used  . Alcohol Use: Yes     Comment: very rare   OB History    No data available     Review of Systems  Constitutional: Negative.  Negative for fever and chills.  Cardiovascular: Negative for chest pain.  Gastrointestinal: Negative for abdominal pain.  Genitourinary: Positive for dysuria, frequency and pelvic pain. Negative for flank pain, vaginal bleeding, vaginal discharge, vaginal pain and menstrual problem.    Allergies  Codeine  Home Medications   Prior to Admission medications   Medication Sig Start Date End Date Taking? Authorizing Provider  ibuprofen (ADVIL,MOTRIN) 200 MG tablet Take 200 mg by mouth every 6 (six) hours as needed.   Yes Historical Provider,  MD  cephALEXin (KEFLEX) 500 MG capsule Take 1 capsule (500 mg total) by mouth 4 (four) times daily. Take all of medicine and drink lots of fluids 05/11/15   Linna Hoff, MD  etonogestrel-ethinyl estradiol (NUVARING) 0.12-0.015 MG/24HR vaginal ring Place 1 each vaginally every 28 (twenty-eight) days. Insert vaginally and leave in place for 3 consecutive weeks, then remove for 1 week.    Historical Provider, MD  phenazopyridine (PYRIDIUM) 200 MG tablet Take 1 tablet (200 mg total) by mouth 3 (three) times daily as needed for pain. Patient not taking: Reported on 05/11/2015 12/20/14   Peyton Najjar, MD  sulfamethoxazole-trimethoprim (BACTRIM DS,SEPTRA DS) 800-160 MG per tablet Take 1 tablet by mouth 2 (two) times daily. Patient not taking: Reported on 05/11/2015 12/20/14   Peyton Najjar, MD   BP 120/81 mmHg  Pulse 77  Temp(Src) 98.5 F (36.9 C) (Oral)  Resp 16  SpO2 100%  LMP 04/30/2015 Physical Exam  Constitutional: She is oriented to person, place, and time. She appears well-developed and well-nourished.  Abdominal: Soft. Bowel sounds are normal. She exhibits no distension and no mass. There is no tenderness. There is no rebound and no guarding.  Neurological: She is alert and oriented to person, place, and time.  Skin: Skin is warm and dry.  Nursing note and vitals reviewed.   ED Course  Procedures (including critical care time) Labs Review Labs Reviewed  POCT URINALYSIS DIP (DEVICE) - Abnormal; Notable for the following:    Hgb urine dipstick TRACE (*)    Leukocytes, UA TRACE (*)    All other  components within normal limits    Imaging Review No results found.   MDM   1. UTI (lower urinary tract infection)        Linna Hoff, MD 05/11/15 2054

## 2015-05-11 NOTE — ED Notes (Signed)
Reports low abdominal discomfort and low back discomfort.  Described as "pressure".  Denies burning with urination, denies vaginal discharge, and having normal bm's. Discomfort for 2 days

## 2015-05-14 ENCOUNTER — Ambulatory Visit (INDEPENDENT_AMBULATORY_CARE_PROVIDER_SITE_OTHER): Payer: BLUE CROSS/BLUE SHIELD

## 2015-05-14 ENCOUNTER — Ambulatory Visit (INDEPENDENT_AMBULATORY_CARE_PROVIDER_SITE_OTHER): Payer: BLUE CROSS/BLUE SHIELD | Admitting: Family Medicine

## 2015-05-14 DIAGNOSIS — K5900 Constipation, unspecified: Secondary | ICD-10-CM

## 2015-05-14 DIAGNOSIS — N9489 Other specified conditions associated with female genital organs and menstrual cycle: Secondary | ICD-10-CM | POA: Diagnosis not present

## 2015-05-14 DIAGNOSIS — R350 Frequency of micturition: Secondary | ICD-10-CM

## 2015-05-14 DIAGNOSIS — R102 Pelvic and perineal pain: Secondary | ICD-10-CM

## 2015-05-14 LAB — POCT CBC
GRANULOCYTE PERCENT: 53.6 % (ref 37–80)
HCT, POC: 38.7 % (ref 37.7–47.9)
Hemoglobin: 12.5 g/dL (ref 12.2–16.2)
Lymph, poc: 3.3 (ref 0.6–3.4)
MCH, POC: 27.3 pg (ref 27–31.2)
MCHC: 32.2 g/dL (ref 31.8–35.4)
MCV: 84.8 fL (ref 80–97)
MID (cbc): 0.7 (ref 0–0.9)
MPV: 7.3 fL (ref 0–99.8)
POC Granulocyte: 4.6 (ref 2–6.9)
POC LYMPH PERCENT: 38.3 %L (ref 10–50)
POC MID %: 8.1 % (ref 0–12)
Platelet Count, POC: 310 10*3/uL (ref 142–424)
RBC: 4.56 M/uL (ref 4.04–5.48)
RDW, POC: 13.3 %
WBC: 8.5 10*3/uL (ref 4.6–10.2)

## 2015-05-14 LAB — HEMOCCULT GUIAC POC 1CARD (OFFICE): FECAL OCCULT BLD: NEGATIVE

## 2015-05-14 LAB — POCT URINALYSIS DIPSTICK
BILIRUBIN UA: NEGATIVE
Glucose, UA: NEGATIVE
KETONES UA: NEGATIVE
NITRITE UA: NEGATIVE
Protein, UA: NEGATIVE
Spec Grav, UA: 1.01
Urobilinogen, UA: 0.2
pH, UA: 6

## 2015-05-14 LAB — POCT WET PREP WITH KOH
KOH PREP POC: NEGATIVE
Trichomonas, UA: NEGATIVE
Yeast Wet Prep HPF POC: NEGATIVE

## 2015-05-14 LAB — POCT UA - MICROSCOPIC ONLY
CASTS, UR, LPF, POC: NEGATIVE
Crystals, Ur, HPF, POC: NEGATIVE
Mucus, UA: NEGATIVE
Yeast, UA: NEGATIVE

## 2015-05-14 MED ORDER — POLYETHYLENE GLYCOL 3350 17 GM/SCOOP PO POWD: 17.0000 g | Freq: Two times a day (BID) | ORAL | Status: AC | PRN

## 2015-05-14 MED ORDER — MAGNESIUM CITRATE PO SOLN: 296.0000 mL | Freq: Once | ORAL | Status: AC

## 2015-05-14 NOTE — Patient Instructions (Signed)
Constipation Constipation is when a person has fewer than three bowel movements a week, has difficulty having a bowel movement, or has stools that are dry, hard, or larger than normal. As people grow older, constipation is more common. If you try to fix constipation with medicines that make you have a bowel movement (laxatives), the problem may get worse. Long-term laxative use may cause the muscles of the colon to become weak. A low-fiber diet, not taking in enough fluids, and taking certain medicines may make constipation worse.  CAUSES   Certain medicines, such as antidepressants, pain medicine, iron supplements, antacids, and water pills.   Certain diseases, such as diabetes, irritable bowel syndrome (IBS), thyroid disease, or depression.   Not drinking enough water.   Not eating enough fiber-rich foods.   Stress or travel.   Lack of physical activity or exercise.   Ignoring the urge to have a bowel movement.   Using laxatives too much.  SIGNS AND SYMPTOMS   Having fewer than three bowel movements a week.   Straining to have a bowel movement.   Having stools that are hard, dry, or larger than normal.   Feeling full or bloated.   Pain in the lower abdomen.   Not feeling relief after having a bowel movement.  DIAGNOSIS  Your health care provider will take a medical history and perform a physical exam. Further testing may be done for severe constipation. Some tests may include:  A barium enema X-ray to examine your rectum, colon, and, sometimes, your small intestine.   A sigmoidoscopy to examine your lower colon.   A colonoscopy to examine your entire colon. TREATMENT  Treatment will depend on the severity of your constipation and what is causing it. Some dietary treatments include drinking more fluids and eating more fiber-rich foods. Lifestyle treatments may include regular exercise. If these diet and lifestyle recommendations do not help, your health care  provider may recommend taking over-the-counter laxative medicines to help you have bowel movements. Prescription medicines may be prescribed if over-the-counter medicines do not work.  HOME CARE INSTRUCTIONS   Eat foods that have a lot of fiber, such as fruits, vegetables, whole grains, and beans.  Limit foods high in fat and processed sugars, such as french fries, hamburgers, cookies, candies, and soda.   A fiber supplement may be added to your diet if you cannot get enough fiber from foods.   Drink enough fluids to keep your urine clear or pale yellow.   Exercise regularly or as directed by your health care provider.   Go to the restroom when you have the urge to go. Do not hold it.   Only take over-the-counter or prescription medicines as directed by your health care provider. Do not take other medicines for constipation without talking to your health care provider first.  SEEK IMMEDIATE MEDICAL CARE IF:   You have bright red blood in your stool.   Your constipation lasts for more than 4 days or gets worse.   You have abdominal or rectal pain.   You have thin, pencil-like stools.   You have unexplained weight loss. MAKE SURE YOU:   Understand these instructions.  Will watch your condition.  Will get help right away if you are not doing well or get worse. Document Released: 06/27/2004 Document Revised: 10/04/2013 Document Reviewed: 07/11/2013 Exit[employer] Patient Information 2015 [employer]. This information is not intended to replace advice given to you by your health care provider. Make sure you discuss any questions   you have with your health care provider. About Constipation  Constipation Overview Constipation is the most common gastrointestinal complaint - about 4 million Americans experience constipation and make 2.5 million physician visits a year to get help for the problem.  Constipation can occur when the colon absorbs too much water, the colon's muscle  contraction is slow or sluggish, and/or there is delayed transit time through the colon.  The result is stool that is hard and dry.  Indicators of constipation include straining during bowel movements greater than 25% of the time, having fewer than three bowel movements per week, and/or the feeling of incomplete evacuation.  There are established guidelines ([place] II ) for defining constipation. A person needs to have two or more of the following symptoms for at least 12 weeks (not necessarily consecutive) in the preceding 12 months: . Straining in  greater than 25% of bowel movements . Lumpy or hard stools in greater than 25% of bowel movements . Sensation of incomplete emptying in greater than 25% of bowel movements . Sensation of anorectal obstruction/blockade in greater than 25% of bowel movements . Manual maneuvers to help empty greater than 25% of bowel movements (e.g., digital evacuation, support of the pelvic floor)  . Less than  3 bowel movements/week . Loose stools are not present, and criteria for irritable bowel syndrome are insufficient  Common Causes of Constipation . Lack of fiber in your diet . Lack of physical activity . Medications, including iron and calcium supplements  . Dairy intake . Dehydration . Abuse of laxatives  Travel  Irritable Bowel Syndrome  Pregnancy  Luteal phase of menstruation (after ovulation and before menses)  Colorectal problems  Intestinal Dysfunction  Treating Constipation  There are several ways of treating constipation, including changes to diet and exercise, use of laxatives, adjustments to the pelvic floor, and scheduled toileting.  These treatments include: . increasing fiber and fluids in the diet  . increasing physical activity . learning muscle coordination   learning proper toileting techniques and toileting modifications   designing and sticking  to a toileting schedule     2007, [employer] Doc.22 

## 2015-05-14 NOTE — Progress Notes (Addendum)
Subjective:    Patient ID: Sandy Gilbert, female    DOB: 21-Dec-1970, 44 y.o.   MRN: [id number] This chart was scribed for Sandy Sorenson, MD by Jolene Provost, Medical Scribe. This patient was seen in Room 13 and the patient's care was started a 5:57 PM.  Chief Complaint  Patient presents with  . Back Pain    right, x today   . Nausea    x today   . Urinary Tract Infection    x 3 days    HPI HPI Comments: Sandy Gilbert is a 44 y.o. female who presents to [facility] complaining of persistent pelvic pressure as well as low back pain that radiates through her bilateral buttocks (right greater than left), and upper back pain. She developed the upper back pain today. She also endorses associated nausea, chills, urinary urgency and frequency. She denies abnormal urine color or smell, hx of kidney stones, or current vaginal discharge. It has been two weeks since her last period. Pt is using the using the Nuvaring for birth control. Pt has had intermittent urinary incontinence for many years. Pt's last pelvic exam was with her gynocologist Dr. Cherly Hensen 1 month ago, and she did not have a pap smear done at that time. Pt has not tried azo for her sx though has a few pills at home.   Pt also states she has chronic constipation, which is unchanged from baseline. She denies blood in stools. She had her gall bladder removed last year after being diagnosed with pancreatitis.   Pt reported to ER three days ago for the same sx and had, had her sx for two days at that time. UA was benign at ER and no culture or other labs were obtained, but as pt was describing pelvic pressure and low back pain she was treated for a UTI with keflex QID*5 days. She has had several UTIs over the past year. Last positive urine culture was 5 mos ago with pan sensitive ecoli.    Past Medical History  Diagnosis Date  . GERD (gastroesophageal reflux disease)    Allergies  Allergen Reactions  . Codeine     Headache.    Current  Outpatient Prescriptions on File Prior to Visit  Medication Sig Dispense Refill  . cephALEXin (KEFLEX) 500 MG capsule Take 1 capsule (500 mg total) by mouth 4 (four) times daily. Take all of medicine and drink lots of fluids 20 capsule 0  . etonogestrel-ethinyl estradiol (NUVARING) 0.12-0.015 MG/24HR vaginal ring Place 1 each vaginally every 28 (twenty-eight) days. Insert vaginally and leave in place for 3 consecutive weeks, then remove for 1 week.    Marland Kitchen ibuprofen (ADVIL,MOTRIN) 200 MG tablet Take 200 mg by mouth every 6 (six) hours as needed.    . phenazopyridine (PYRIDIUM) 200 MG tablet Take 1 tablet (200 mg total) by mouth 3 (three) times daily as needed for pain. (Patient not taking: Reported on 05/11/2015) 10 tablet 0  . sulfamethoxazole-trimethoprim (BACTRIM DS,SEPTRA DS) 800-160 MG per tablet Take 1 tablet by mouth 2 (two) times daily. (Patient not taking: Reported on 05/11/2015) 14 tablet 0   No current facility-administered medications on file prior to visit.     Review of Systems  Constitutional: Negative for fever, chills, diaphoresis, activity change, appetite change, fatigue and unexpected weight change.  Gastrointestinal: Positive for nausea, abdominal pain and constipation. Negative for vomiting, diarrhea, blood in stool, abdominal distention, anal bleeding and rectal pain.  Genitourinary: Positive for urgency, frequency, hematuria and  pelvic pain. Negative for dysuria, flank pain, decreased urine volume, vaginal bleeding, vaginal discharge, difficulty urinating, genital sores, vaginal pain, menstrual problem and dyspareunia.  Musculoskeletal: Positive for back pain. Negative for gait problem.  Skin: Negative for rash.  Hematological: Negative for adenopathy.  Psychiatric/Behavioral: The patient is not nervous/anxious.        Objective:  LMP 04/30/2015   Physical Exam  Constitutional: She is oriented to person, place, and time. She appears well-developed and well-nourished. No  distress.  HENT:  Head: Normocephalic and atraumatic.  Eyes: Pupils are equal, round, and reactive to light.  Neck: Neck supple.  Cardiovascular: Normal rate, regular rhythm and normal heart sounds.   No murmur heard. Pulmonary/Chest: Effort normal and breath sounds normal. No respiratory distress. She has no wheezes.  Abdominal: Soft. Bowel sounds are normal. There is tenderness.  Mild TTP bilateral LLQ RLQ pain. No hepatosplenomegaly. Negative murphy's sign, no suprapubic pain or pressure. Some poorly defined lower abdominal masses that are likely stool. Full ROM in both hips. No trochanteric tenderness. No pain in bony pelvis. Negative straight leg raise.   Genitourinary:  Normal labia, vaginal wall, and cervix. Minimal amount of thin white vaginal discharge, which appears physiologic. No adenexal tenderness, but very mild positive cervical and uterus tenderness. No bladder fullness. Rectal exam normal with a moderate about of soft brown stool in vault.   Musculoskeletal: Normal range of motion.  Neurological: She is alert and oriented to person, place, and time. Coordination normal.  Skin: Skin is warm and dry. She is not diaphoretic.  Psychiatric: She has a normal mood and affect. Her behavior is normal.  Nursing note and vitals reviewed.  [facility] reading (PRIMARY) by  Dr. Clelia Croft. KUB: Large stool burden, large amount of gas in splenic flexure     Results for orders placed or performed in visit on 05/14/15  POCT urinalysis dipstick  Result Value Ref Range   Color, UA yellow    Clarity, UA clear    Glucose, UA neg    Bilirubin, UA neg    Ketones, UA neg    Spec Grav, UA 1.010    Blood, UA trace    pH, UA 6.0    Protein, UA neg    Urobilinogen, UA 0.2    Nitrite, UA neg    Leukocytes, UA Trace (A) Negative  POCT UA - Microscopic Only  Result Value Ref Range   WBC, Ur, HPF, POC 2-4    RBC, urine, microscopic 1-3    Bacteria, U Microscopic 1+    Mucus, UA neg    Epithelial  cells, urine per micros 1-3    Crystals, Ur, HPF, POC neg    Casts, Ur, LPF, POC neg    Yeast, UA neg   POCT Wet Prep with KOH  Result Value Ref Range   Trichomonas, UA Negative    Clue Cells Wet Prep HPF POC 0-2    Epithelial Wet Prep HPF POC Moderate Few, Moderate, Many   Yeast Wet Prep HPF POC neg    Bacteria Wet Prep HPF POC Moderate (A) None, Few   RBC Wet Prep HPF POC 0-2    WBC Wet Prep HPF POC 8-12    KOH Prep POC Negative   POCT CBC  Result Value Ref Range   WBC 8.5 4.6 - 10.2 K/uL   Lymph, poc 3.3 0.6 - 3.4   POC LYMPH PERCENT 38.3 10 - 50 %L   MID (cbc) 0.7 0 - 0.9  POC MID % 8.1 0 - 12 %M   POC Granulocyte 4.6 2 - 6.9   Granulocyte percent 53.6 37 - 80 %G   RBC 4.56 4.04 - 5.48 M/uL   Hemoglobin 12.5 12.2 - 16.2 g/dL   HCT, POC 16.1 09.6 - 47.9 %   MCV 84.8 80 - 97 fL   MCH, POC 27.3 27 - 31.2 pg   MCHC 32.2 31.8 - 35.4 g/dL   RDW, POC 04.5 %   Platelet Count, POC 310 142 - 424 K/uL   MPV 7.3 0 - 99.8 fL  Hemoccult - 1 Card (office)  Result Value Ref Range   Fecal Occult Blood, POC Negative Negative   Card #1 Date 08/01/216    Card #2 Fecal Occult Blod, POC     Card #2 Date     Card #3 Fecal Occult Blood, POC     Card #3 Date     \ Assessment & Plan:   1. Frequent urination   2. Pelvic pressure in female   3. Constipation, unspecified constipation type   Suspect sxs are due to constipation.  Complete course of keflex for UTI - on day 3/5 today - though there is a possibility that she might not have had a UTI initially or now since her sxs have been atypical from her prior UTIs and prior UA was benign and unchanged today.  Drink mag citrate bottle tonight. Start miralax bid if no sig response tomorrow and then add in senokot S prn after several days. Encouraged pt to use miralax chronically at low dose to prevent recurrence.  Consider starting qhs magnesium supp 400mg  qhs to help chronic constipation.    Orders Placed This Encounter  Procedures  . Urine  culture  . DG Abd 1 View    Standing Status: Future     Number of Occurrences: 1     Standing Expiration Date: 05/13/2016    Order Specific Question:  Reason for Exam (SYMPTOM  OR DIAGNOSIS REQUIRED)    Answer:  pelvic pressure, bilateral lower quadrant pain, suspect constipation    Order Specific Question:  Is the patient pregnant?    Answer:  No    Order Specific Question:  Preferred imaging location?    Answer:  External  . POCT urinalysis dipstick  . POCT UA - Microscopic Only  . POCT Wet Prep with KOH  . POCT CBC  . Hemoccult - 1 Card (office)    Meds ordered this encounter  Medications  . polyethylene glycol powder (GLYCOLAX/MIRALAX) powder    Sig: Take 17 g by mouth 2 (two) times daily as needed.    Dispense:  250 g    Refill:  11  . magnesium citrate solution    Sig: Take 296 mLs by mouth once.    Dispense:  300 mL    Refill:  0    I personally performed the services described in this documentation, which was scribed in my presence. The recorded information has been reviewed and considered, and addended by me as needed.  Sandy Sorenson, MD MPH

## 2015-05-16 LAB — URINE CULTURE
COLONY COUNT: NO GROWTH
ORGANISM ID, BACTERIA: NO GROWTH

## 2015-05-18 ENCOUNTER — Telehealth: Payer: Self-pay

## 2015-05-18 NOTE — Telephone Encounter (Signed)
Called pt - she is going to increase the miralax a lot tonight then come in tomorrow to 102 for recheck - we will decide between repeating xray and then getting pelvic US vs abd/pelvic CT scan after her exam tomorrow

## 2015-05-18 NOTE — Telephone Encounter (Signed)
Patient was seen on Monday by Dr. Clelia Croft. She had an x-ray and she was constipated. States she did what Dr. Clelia Croft told her to do: magnesium, miralax, then senekot. The regimen is not working. Please call back at [phone] (work #).

## 2015-05-19 ENCOUNTER — Ambulatory Visit (HOSPITAL_COMMUNITY)
Admission: RE | Admit: 2015-05-19 | Discharge: 2015-05-19 | Disposition: A | Discharge status: Home / Self Care | Payer: BLUE CROSS/BLUE SHIELD | Source: Ambulatory Visit | Attending: Family Medicine | Admitting: Family Medicine

## 2015-05-19 ENCOUNTER — Ambulatory Visit (INDEPENDENT_AMBULATORY_CARE_PROVIDER_SITE_OTHER): Payer: BLUE CROSS/BLUE SHIELD | Admitting: Family Medicine

## 2015-05-19 ENCOUNTER — Telehealth: Payer: Self-pay

## 2015-05-19 VITALS — BP 124/70 | HR 85 | Temp 98.1°F | Resp 18 | Ht 65.75 in | Wt 169.4 lb

## 2015-05-19 DIAGNOSIS — R140 Abdominal distension (gaseous): Secondary | ICD-10-CM

## 2015-05-19 DIAGNOSIS — K5900 Constipation, unspecified: Secondary | ICD-10-CM | POA: Insufficient documentation

## 2015-05-19 DIAGNOSIS — M545 Low back pain, unspecified: Secondary | ICD-10-CM

## 2015-05-19 DIAGNOSIS — Z9049 Acquired absence of other specified parts of digestive tract: Secondary | ICD-10-CM | POA: Diagnosis not present

## 2015-05-19 DIAGNOSIS — R102 Pelvic and perineal pain unspecified side: Secondary | ICD-10-CM

## 2015-05-19 DIAGNOSIS — K219 Gastro-esophageal reflux disease without esophagitis: Secondary | ICD-10-CM | POA: Insufficient documentation

## 2015-05-19 DIAGNOSIS — N281 Cyst of kidney, acquired: Secondary | ICD-10-CM | POA: Insufficient documentation

## 2015-05-19 DIAGNOSIS — K5909 Other constipation: Secondary | ICD-10-CM

## 2015-05-19 LAB — POCT CBC
GRANULOCYTE PERCENT: 58 % (ref 37–80)
HCT, POC: 38.7 % (ref 37.7–47.9)
HEMOGLOBIN: 12.4 g/dL (ref 12.2–16.2)
LYMPH, POC: 1.9 (ref 0.6–3.4)
MCH: 27.2 pg (ref 27–31.2)
MCHC: 32 g/dL (ref 31.8–35.4)
MCV: 84.9 fL (ref 80–97)
MID (cbc): 0.4 (ref 0–0.9)
MPV: 7.5 fL (ref 0–99.8)
POC Granulocyte: 3.3 (ref 2–6.9)
POC LYMPH PERCENT: 34.2 %L (ref 10–50)
POC MID %: 7.8 %M (ref 0–12)
Platelet Count, POC: 311 10*3/uL (ref 142–424)
RBC: 4.55 M/uL (ref 4.04–5.48)
RDW, POC: 13.2 %
WBC: 5.7 10*3/uL (ref 4.6–10.2)

## 2015-05-19 LAB — COMPREHENSIVE METABOLIC PANEL
ALT: 8 U/L (ref 6–29)
AST: 11 U/L (ref 10–30)
Albumin: 3.8 g/dL (ref 3.6–5.1)
Alkaline Phosphatase: 42 U/L (ref 33–115)
BILIRUBIN TOTAL: 0.3 mg/dL (ref 0.2–1.2)
BUN: 16 mg/dL (ref 7–25)
CALCIUM: 8.9 mg/dL (ref 8.6–10.2)
CHLORIDE: 105 mmol/L (ref 98–110)
CO2: 26 mmol/L (ref 20–31)
Creat: 0.81 mg/dL (ref 0.50–1.10)
Glucose, Bld: 79 mg/dL (ref 65–99)
POTASSIUM: 4.5 mmol/L (ref 3.5–5.3)
Sodium: 138 mmol/L (ref 135–146)
Total Protein: 6.7 g/dL (ref 6.1–8.1)

## 2015-05-19 LAB — POCT URINALYSIS DIPSTICK
Bilirubin, UA: NEGATIVE
Glucose, UA: NEGATIVE
KETONES UA: NEGATIVE
Nitrite, UA: NEGATIVE
PH UA: 6.5
Protein, UA: NEGATIVE
Spec Grav, UA: 1.025
Urobilinogen, UA: 1

## 2015-05-19 LAB — POCT URINE PREGNANCY: PREG TEST UR: NEGATIVE

## 2015-05-19 LAB — POCT SEDIMENTATION RATE: POCT SED RATE: 30 mm/hr — AB (ref 0–22)

## 2015-05-19 MED ORDER — IOHEXOL 300 MG/ML  SOLN
100.0000 mL | Freq: Once | INTRAMUSCULAR | Status: AC | PRN
Start: 2015-05-19 — End: 2015-05-19
  Administered 2015-05-19: 100 mL via INTRAVENOUS

## 2015-05-19 MED ORDER — LACTULOSE 20 GM/30ML PO SOLN: 30.0000 mL | Freq: Two times a day (BID) | ORAL | Status: AC | PRN

## 2015-05-19 NOTE — Patient Instructions (Addendum)
Start your 1st bottle of oral contrast now and the 2nd at 12:45. Go to [facility] and register at the emergency department. REGISTER AS OUTPATIENT CT. DO NOT REGISTER AS ED PATIENT.   About Constipation  Constipation Overview Constipation is the most common gastrointestinal complaint - about 4 million Americans experience constipation and make 2.5 million physician visits a year to get help for the problem.  Constipation can occur when the colon absorbs too much water, the colon's muscle contraction is slow or sluggish, and/or there is delayed transit time through the colon.  The result is stool that is hard and dry.  Indicators of constipation include straining during bowel movements greater than 25% of the time, having fewer than three bowel movements per week, and/or the feeling of incomplete evacuation.  There are established guidelines ([place] II ) for defining constipation. A person needs to have two or more of the following symptoms for at least 12 weeks (not necessarily consecutive) in the preceding 12 months: . Straining in  greater than 25% of bowel movements . Lumpy or hard stools in greater than 25% of bowel movements . Sensation of incomplete emptying in greater than 25% of bowel movements . Sensation of anorectal obstruction/blockade in greater than 25% of bowel movements . Manual maneuvers to help empty greater than 25% of bowel movements (e.g., digital evacuation, support of the pelvic floor)  . Less than  3 bowel movements/week . Loose stools are not present, and criteria for irritable bowel syndrome are insufficient  Common Causes of Constipation . Lack of fiber in your diet . Lack of physical activity . Medications, including iron and calcium supplements  . Dairy intake . Dehydration . Abuse of laxatives  Travel  Irritable Bowel Syndrome  Pregnancy  Luteal phase of menstruation (after ovulation and before menses)  Colorectal problems  Intestinal  Dysfunction  Treating Constipation  There are several ways of treating constipation, including changes to diet and exercise, use of laxatives, adjustments to the pelvic floor, and scheduled toileting.  These treatments include: . increasing fiber and fluids in the diet  . increasing physical activity . learning muscle coordination   learning proper toileting techniques and toileting modifications   designing and sticking  to a toileting schedule     2007, [employer] Doc.22

## 2015-05-19 NOTE — Telephone Encounter (Signed)
Patient was seen today and was told she would get a call back about her ct results. Please call! [phone]

## 2015-05-19 NOTE — Progress Notes (Signed)
Subjective:  This chart was scribed for Sandy Sorenson, MD by Broadus John, Medical Scribe. This patient was seen in Room 5 and the patient's care was started at 10:43 AM.   Patient ID: Sandy Gilbert, female    DOB: January 20, 1971, 44 y.o.   MRN: [id number]  Chief Complaint  Patient presents with  . Follow-up    still constipated- took a triple dose of Miralax last night around 11:45 (no relief)   HPI HPI Comments: CYNCERE RUHE is a 44 y.o. female who presents to [facility] for a follow up regarding her frequent urination, pelvic pressure, and constipation symptoms. Pt was seen by me on 05/14/2015. She had been treated for 3 days for UTI. Her symptoms for UTI though were atypical for her; she was mainly having pelvic and back pressure. Her UA at [facility] looked benign and was started on Keflex for her symptoms. Complete exam was done including normal CPC, normal UA, and negative urine culture. On pelvic exam there was no tenderness, normal rectal exam, and an KUB consistent with prominent stool and urine. Pt diagnosed with possible constipation, treated with a dose of magnesium citrated followed by Miralax, and senokot S. Pt had diarrhea and several small stools, but never passed any significant amount of fecal matter, so miralax was increased without result. Pt was asked to return to clinic today for further evaluation on her pelvic sand back pain.   Pt indicates that with her 3 does of Miralax last night, she has not had a diarrhea episode. Pt still presents with pelvic tightness. She states that her symptoms are intermittent. She reports symptoms of abdominal bloating and distention, and chest tightness. She denies appetite change, abnormal discharge, heart burn, indigestion, or urinary symptoms.    Patient Active Problem List   Diagnosis Date Noted  . Obstruction of bile duct 05/22/2014  . Nonspecific (abnormal) findings on radiological and other examination of  biliary tract 05/22/2014  . Pancreatitis 05/17/2014  . Gallstones 05/17/2014   Past Medical History  Diagnosis Date  . GERD (gastroesophageal reflux disease)    Past Surgical History  Procedure Laterality Date  . Cholecystectomy N/A 05/22/2014    Procedure: LAPAROSCOPIC CHOLECYSTECTOMY WITH INTRAOPERATIVE CHOLANGIOGRAM;  Surgeon: Kandis Cocking, MD;  Location: [facility];  Service: General;  Laterality: N/A;  . Ercp N/A 05/23/2014    Procedure: ENDOSCOPIC RETROGRADE CHOLANGIOPANCREATOGRAPHY (ERCP);  Surgeon: Rachael Fee, MD;  Location: [facility];  Service: Gastroenterology;  Laterality: N/A;   Allergies  Allergen Reactions  . Codeine     Headache.    Prior to Admission medications   Medication Sig Start Date End Date Taking? Authorizing Provider  etonogestrel-ethinyl estradiol (NUVARING) 0.12-0.015 MG/24HR vaginal ring Place 1 each vaginally every 28 (twenty-eight) days. Insert vaginally and leave in place for 3 consecutive weeks, then remove for 1 week.   Yes Historical Provider, MD  ibuprofen (ADVIL,MOTRIN) 200 MG tablet Take 200 mg by mouth every 6 (six) hours as needed.   Yes Historical Provider, MD  polyethylene glycol powder (GLYCOLAX/MIRALAX) powder Take 17 g by mouth 2 (two) times daily as needed. 05/14/15  Yes Sherren Mocha, MD  magnesium citrate solution Take 296 mLs by mouth once. Patient not taking: Reported on 05/19/2015 05/14/15   Sherren Mocha, MD   History   Social History  . Marital Status: Married    Spouse Name: N/A  . Number of Children: N/A  . Years of Education:  N/A   Occupational History  . Not on file.   Social History Main Topics  . Smoking status: Former Smoker    Quit date: 03/04/2000  . Smokeless tobacco: Never Used  . Alcohol Use: Yes     Comment: very rare  . Drug Use: No  . Sexual Activity: Not on file   Other Topics Concern  . Not on file   Social History Narrative     Review of Systems  Constitutional: Positive for activity change and fatigue.  Negative for fever, chills, appetite change and unexpected weight change.  Respiratory: Positive for chest tightness. Negative for shortness of breath.   Cardiovascular: Negative for chest pain and leg swelling.  Gastrointestinal: Positive for nausea, abdominal pain and constipation. Negative for vomiting, diarrhea, blood in stool, abdominal distention, anal bleeding and rectal pain.  Genitourinary: Positive for flank pain and pelvic pain. Negative for dysuria, urgency, frequency, hematuria, decreased urine volume, vaginal bleeding, vaginal discharge, difficulty urinating and genital sores.  Musculoskeletal: Negative for gait problem.  Skin: Negative for rash.  Hematological: Negative for adenopathy.  Psychiatric/Behavioral: Negative for sleep disturbance.      Objective:   Physical Exam  Constitutional: She is oriented to person, place, and time. She appears well-developed and well-nourished. No distress.  HENT:  Head: Normocephalic and atraumatic.  Eyes: EOM are normal. Pupils are equal, round, and reactive to light.  Neck: Neck supple.  Cardiovascular: Normal rate, regular rhythm and normal heart sounds.   Pulses:      Dorsalis pedis pulses are 2+ on the right side.  Pulmonary/Chest: Effort normal and breath sounds normal. No respiratory distress. She has no wheezes. She has no rales.  Abdominal: There is no rebound, no guarding, no CVA tenderness and no tenderness at McBurney's point.  Suprapubic and RUQ poorly defined abdominal masses. Mild generalized tenderness. Hyperactive bowel sounds.   Musculoskeletal: She exhibits no edema (no lower extremities edema).  Neurological: She is alert and oriented to person, place, and time. No cranial nerve deficit.  Skin: Skin is warm and dry.  Psychiatric: She has a normal mood and affect. Her behavior is normal.  Nursing note and vitals reviewed.  BP 124/70 mmHg  Pulse 85  Temp(Src) 98.1 F (36.7 C) (Oral)  Resp 18  Ht 5' 5.75" (1.67  m)  Wt 169 lb 6 oz (76.828 kg)  BMI 27.55 kg/m2  SpO2 98%  LMP 04/30/2015     Assessment & Plan:   1. Abdominal distension - suspected lower abd and back pain is due to constipation but pt has had a vew soft BMs w/o improvement in sxs. Stent for stat abd/pelvic CT today to sure no ovarian mass, atypical nephro - or chole-lithiasis.   2. Pelvic pain in female - Pt had a pelvic exam by myself less than 1 wk ago that was nml. Pt has NOT had a UTI as cause  3. Chronic constipation - try high dose miralax cleanout.  4. Bilateral low back pain without sciatica     Orders Placed This Encounter  Procedures  . CT Abdomen Pelvis W Contrast    Standing Status: Future     Number of Occurrences: 1     Standing Expiration Date: 08/18/2016    Order Specific Question:  Reason for Exam (SYMPTOM  OR DIAGNOSIS REQUIRED)    Answer:  abd distension, abd masses on exam    Order Specific Question:  Is the patient pregnant?    Answer:  No  Order Specific Question:  Preferred imaging location?    Answer:  [facility]  . Comprehensive metabolic panel  . POCT urine pregnancy  . POCT urinalysis dipstick  . POCT CBC  . POCT SEDIMENTATION RATE    Meds ordered this encounter  Medications  . Lactulose 20 GM/30ML SOLN    Sig: Take 30 mLs (20 g total) by mouth 2 (two) times daily as needed (constipation).    Dispense:  240 mL    Refill:  0    I personally performed the services described in this documentation, which was scribed in my presence. The recorded information has been reviewed and considered, and addended by me as needed.  Sandy Sorenson, MD MPH

## 2015-05-20 NOTE — Telephone Encounter (Signed)
1. No acute findings. 2. No CT evidence of pyelonephritis. No hydronephrosis. 3. Moderate increased stool noted throughout the colon. 4. Status post cholecystectomy. Left renal cyst.  This was released to my chart. I have called patient to advise CT looks normal except constipation. Continue current treatment. Left message to advise results sent via my chart

## 2015-12-13 ENCOUNTER — Emergency Department (HOSPITAL_BASED_OUTPATIENT_CLINIC_OR_DEPARTMENT_OTHER)
Admission: EM | Admit: 2015-12-13 | Discharge: 2015-12-14 | Disposition: A | Discharge status: Home / Self Care | Payer: BLUE CROSS/BLUE SHIELD | Attending: Emergency Medicine | Admitting: Emergency Medicine

## 2015-12-13 ENCOUNTER — Encounter (HOSPITAL_BASED_OUTPATIENT_CLINIC_OR_DEPARTMENT_OTHER): Payer: Self-pay | Admitting: Emergency Medicine

## 2015-12-13 ENCOUNTER — Emergency Department (HOSPITAL_BASED_OUTPATIENT_CLINIC_OR_DEPARTMENT_OTHER): Payer: BLUE CROSS/BLUE SHIELD

## 2015-12-13 DIAGNOSIS — M25519 Pain in unspecified shoulder: Secondary | ICD-10-CM | POA: Insufficient documentation

## 2015-12-13 DIAGNOSIS — R630 Anorexia: Secondary | ICD-10-CM | POA: Diagnosis not present

## 2015-12-13 DIAGNOSIS — R05 Cough: Secondary | ICD-10-CM | POA: Diagnosis not present

## 2015-12-13 DIAGNOSIS — M79629 Pain in unspecified upper arm: Secondary | ICD-10-CM | POA: Insufficient documentation

## 2015-12-13 DIAGNOSIS — M545 Low back pain: Secondary | ICD-10-CM | POA: Insufficient documentation

## 2015-12-13 DIAGNOSIS — R079 Chest pain, unspecified: Secondary | ICD-10-CM | POA: Insufficient documentation

## 2015-12-13 DIAGNOSIS — Z8719 Personal history of other diseases of the digestive system: Secondary | ICD-10-CM | POA: Diagnosis not present

## 2015-12-13 DIAGNOSIS — R0981 Nasal congestion: Secondary | ICD-10-CM | POA: Insufficient documentation

## 2015-12-13 DIAGNOSIS — Z87891 Personal history of nicotine dependence: Secondary | ICD-10-CM | POA: Insufficient documentation

## 2015-12-13 DIAGNOSIS — R109 Unspecified abdominal pain: Secondary | ICD-10-CM | POA: Insufficient documentation

## 2015-12-13 DIAGNOSIS — R0789 Other chest pain: Secondary | ICD-10-CM | POA: Insufficient documentation

## 2015-12-13 DIAGNOSIS — R110 Nausea: Secondary | ICD-10-CM | POA: Insufficient documentation

## 2015-12-13 LAB — CBC
HEMATOCRIT: 37.2 % (ref 36.0–46.0)
HEMOGLOBIN: 12.2 g/dL (ref 12.0–15.0)
MCH: 28.8 pg (ref 26.0–34.0)
MCHC: 32.8 g/dL (ref 30.0–36.0)
MCV: 87.7 fL (ref 78.0–100.0)
Platelets: 286 10*3/uL (ref 150–400)
RBC: 4.24 MIL/uL (ref 3.87–5.11)
RDW: 12.7 % (ref 11.5–15.5)
WBC: 7.8 10*3/uL (ref 4.0–10.5)

## 2015-12-13 LAB — BASIC METABOLIC PANEL
ANION GAP: 8 (ref 5–15)
BUN: 16 mg/dL (ref 6–20)
CO2: 26 mmol/L (ref 22–32)
Calcium: 8.7 mg/dL — ABNORMAL LOW (ref 8.9–10.3)
Chloride: 105 mmol/L (ref 101–111)
Creatinine, Ser: 0.76 mg/dL (ref 0.44–1.00)
GFR calc Af Amer: >60 mL/min (ref >60)
GLUCOSE: 95 mg/dL (ref 65–99)
POTASSIUM: 3.7 mmol/L (ref 3.5–5.1)
Sodium: 139 mmol/L (ref 135–145)

## 2015-12-13 LAB — TROPONIN I: Troponin I: <0.03 ng/mL (ref <0.031)

## 2015-12-13 NOTE — ED Notes (Signed)
Patient reports that she started to have chest pain that woke her up early this am. The patient has tightness up into her shoulders and a dry cough all day

## 2015-12-13 NOTE — ED Provider Notes (Signed)
CSN: [id number]     Arrival date & time 12/13/15  2009 History  By signing my name below, I, Sandy Gilbert, attest that this documentation has been prepared under the direction and in the presence of Eber Hong, MD. Electronically Signed: Gonzella Gilbert, Scribe. 12/13/2015. 11:55 PM.   Chief Complaint  Patient presents with  . Chest Pain   The history is provided by the patient. No language interpreter was used.   HPI Comments: Sandy Gilbert is a 45 y.o. female with a hx of pancreatitis a few years ago, who presents to the Emergency Department complaining of sudden onset of waxing and waning, upper and lower chest tightness and pressure with associated nausea and abd pain when she woke up this morning. Pt also notes associated, mild congestion, loss of appetite, dry cough, mild, waxing and waning arm pain, onset of fatigue, upper back pain and shoulder pain as the day progressed. Pt's chest pain is mildly improved with deep respirations. She also notes that she takes birth control and reports that she received a flu test at urgent care earlier today, which came back negative. She denies fever, leg swelling, sore thoat, rhinorrhea, visual disturbance, recent travel, recent surgery, consumption of daily medications, frequent exercise, sick contact, and hx of similar chest discomfort. Pt states that her pain is not similar to her previous pancreatitis pain and also notes that she did not receive a flu shot this year. Pt's father, who was a smoker and had a hx of DM, has a hx of blood clot and two MIs, one in his mid 69s and the other in his late 22s.   Past Medical History  Diagnosis Date  . GERD (gastroesophageal reflux disease)    Past Surgical History  Procedure Laterality Date  . Cholecystectomy N/A 05/22/2014    Procedure: LAPAROSCOPIC CHOLECYSTECTOMY WITH INTRAOPERATIVE CHOLANGIOGRAM;  Surgeon: Kandis Cocking, MD;  Location: [facility];  Service: General;  Laterality: N/A;  . Ercp  N/A 05/23/2014    Procedure: ENDOSCOPIC RETROGRADE CHOLANGIOPANCREATOGRAPHY (ERCP);  Surgeon: Rachael Fee, MD;  Location: [facility];  Service: Gastroenterology;  Laterality: N/A;   History reviewed. No pertinent family history. Social History  Substance Use Topics  . Smoking status: Former Smoker    Quit date: 03/04/2000  . Smokeless tobacco: Never Used  . Alcohol Use: Yes     Comment: very rare   OB History    No data available     Review of Systems  Constitutional: Positive for appetite change. Negative for fever.  HENT: Positive for congestion. Negative for rhinorrhea and sore throat.   Eyes: Negative for visual disturbance.  Respiratory: Positive for cough and chest tightness.   Cardiovascular: Positive for chest pain. Negative for leg swelling.  Gastrointestinal: Positive for nausea and abdominal pain.  Musculoskeletal: Positive for myalgias ( arm pain and shoulder pain) and back pain.   Allergies  Codeine  Home Medications   Prior to Admission medications   Medication Sig Start Date End Date Taking? Authorizing Provider  etonogestrel-ethinyl estradiol (NUVARING) 0.12-0.015 MG/24HR vaginal ring Place 1 each vaginally every 28 (twenty-eight) days. Insert vaginally and leave in place for 3 consecutive weeks, then remove for 1 week.    Historical Provider, MD  ibuprofen (ADVIL,MOTRIN) 200 MG tablet Take 200 mg by mouth every 6 (six) hours as needed.    Historical Provider, MD  Lactulose 20 GM/30ML SOLN Take 30 mLs (20 g total) by mouth 2 (two) times daily as needed (constipation).  05/19/15   Sherren Mocha, MD  magnesium citrate solution Take 296 mLs by mouth once. Patient not taking: Reported on 05/19/2015 05/14/15   Sherren Mocha, MD  polyethylene glycol powder (GLYCOLAX/MIRALAX) powder Take 17 g by mouth 2 (two) times daily as needed. 05/14/15   Sherren Mocha, MD   BP 100/63 mmHg  Pulse 76  Temp(Src) 98 F (36.7 C) (Oral)  Resp 17  Ht  (1.651 m)  Wt 170 lb (77.111 kg)  BMI 28.29  kg/m2  SpO2 99%  LMP 12/05/2015 Physical Exam  Constitutional: She appears well-developed and well-nourished. No distress.  HENT:  Head: Normocephalic and atraumatic.  Mouth/Throat: Oropharynx is clear and moist. No oropharyngeal exudate.  Eyes: Conjunctivae and EOM are normal. Pupils are equal, round, and reactive to light. Right eye exhibits no discharge. Left eye exhibits no discharge. No scleral icterus.  Neck: Normal range of motion. Neck supple. No JVD present. No thyromegaly present.  Cardiovascular: Normal rate, regular rhythm, normal heart sounds and intact distal pulses.  Exam reveals no gallop and no friction rub.   No murmur heard. Pulmonary/Chest: Effort normal and breath sounds normal. No respiratory distress. She has no wheezes. She has no rales.  Abdominal: Soft. Bowel sounds are normal. She exhibits no distension and no mass. There is no tenderness.  Musculoskeletal: Normal range of motion. She exhibits no edema or tenderness.  Lymphadenopathy:    She has no cervical adenopathy.  Neurological: She is alert. Coordination normal.  Skin: Skin is warm and dry. No rash noted. No erythema.  Psychiatric: She has a normal mood and affect. Her behavior is normal.  Nursing note and vitals reviewed.  ED Course  Procedures  DIAGNOSTIC STUDIES:    Oxygen Saturation is 100% on RA, normal by my interpretation.   COORDINATION OF CARE:  11:34 PM Will review lab work, blood test, and imaging. Discussed treatment plan with pt at bedside and pt agreed to plan.   Labs Review Labs Reviewed  BASIC METABOLIC PANEL - Abnormal; Notable for the following:    Calcium 8.7 (*)    All other components within normal limits  D-DIMER, QUANTITATIVE (NOT AT [facility]) - Abnormal; Notable for the following:    D-Dimer, Quant 0.75 (*)    All other components within normal limits  CBC  TROPONIN I    Imaging Review Dg Chest 2 View  12/13/2015  CLINICAL DATA:  Chest pain and cough EXAM: CHEST  2  VIEW COMPARISON:  12/24/2013 FINDINGS: Cardiac shadow is within normal limits. The previously seen left basilar infiltrate has resolved in the interval. No bony abnormality is noted. IMPRESSION: No acute abnormality seen. Electronically Signed   By: Alcide Clever M.D.   On: 12/13/2015 20:42   Ct Angio Chest Pe W/cm &/or Wo Cm  12/14/2015  CLINICAL DATA:  Acute onset of generalized chest pain, dry cough, shoulder tightness and elevated D-dimer. Nausea and fatigue. Initial encounter. EXAM: CT ANGIOGRAPHY CHEST WITH CONTRAST TECHNIQUE: Multidetector CT imaging of the chest was performed using the standard protocol during bolus administration of intravenous contrast. Multiplanar CT image reconstructions and MIPs were obtained to evaluate the vascular anatomy. CONTRAST:  75mL OMNIPAQUE IOHEXOL 350 MG/ML SOLN COMPARISON:  Chest radiograph performed 12/13/2015 FINDINGS: There is no evidence of pulmonary embolus. Minimal bilateral atelectasis is noted. The lungs are otherwise clear. There is no evidence of significant focal consolidation, pleural effusion or pneumothorax. No masses are identified; no abnormal focal contrast enhancement is seen. The mediastinum  is unremarkable in appearance. No mediastinal lymphadenopathy is seen. No pericardial effusion is identified. The great vessels are grossly unremarkable. No axillary lymphadenopathy is seen. The visualized portions of the thyroid gland are unremarkable in appearance. The visualized portions of the liver and spleen are unremarkable. The patient is status post median sternotomy. A 3.5 cm left renal cyst is partially imaged. No acute osseous abnormalities are seen. Review of the MIP images confirms the above findings. IMPRESSION: 1. No evidence of pulmonary embolus. 2. Minimal bilateral atelectasis noted.  Lungs otherwise clear. 3. Left renal cyst noted. Electronically Signed   By: Roanna Raider M.D.   On: 12/14/2015 01:23   I have personally reviewed and evaluated  these images and lab results as part of my medical decision-making.   EKG Interpretation   Date/Time:  Thursday December 13 2015 20:22:17 EST Ventricular Rate:  70 PR Interval:  150 QRS Duration: 90 QT Interval:  382 QTC Calculation: 412 R Axis:   84 Text Interpretation:  Normal sinus rhythm Normal ECG Confirmed by  ZACKOWSKI  MD, SCOTT ([id number]) on 12/13/2015 8:50:12 PM      MDM   Final diagnoses:  Chest pain, unspecified chest pain type   I personally performed the services described in this documentation, which was scribed in my presence. The recorded information has been reviewed and is accurate.   CT scan negative, d-dimer was elevated, labs otherwise unremarkable, EKG and troponin both normal and after 18 hours of ongoing symptoms have not resolved and no history of exertional symptoms or significant risk for acute coronary syndrome is very unlikely that the patient would be having a heart attack. Her heart score is extremely low, she appears stable for discharge and voiced her understanding to the discharge instructions at the bedside.     Eber Hong, MD 12/14/15 [phone]

## 2015-12-14 ENCOUNTER — Emergency Department (HOSPITAL_BASED_OUTPATIENT_CLINIC_OR_DEPARTMENT_OTHER): Payer: BLUE CROSS/BLUE SHIELD

## 2015-12-14 LAB — D-DIMER, QUANTITATIVE: D-Dimer, Quant: 0.75 ug/mL-FEU — ABNORMAL HIGH (ref 0.00–0.50)

## 2015-12-14 MED ORDER — IOHEXOL 350 MG/ML SOLN
75.0000 mL | Freq: Once | INTRAVENOUS | Status: AC | PRN
  Administered 2015-12-14: 75 mL via INTRAVENOUS

## 2015-12-14 NOTE — ED Notes (Signed)
Pt returned from CT °

## 2015-12-14 NOTE — Discharge Instructions (Signed)

## 2015-12-14 NOTE — ED Notes (Signed)
Pt verbalizes understanding of d/c instructions and denies any further need at this time. 

## 2017-08-01 ENCOUNTER — Ambulatory Visit (HOSPITAL_COMMUNITY)
Admission: EM | Admit: 2017-08-01 | Discharge: 2017-08-01 | Disposition: A | Discharge status: Home / Self Care | Payer: BLUE CROSS/BLUE SHIELD | Attending: Family Medicine | Admitting: Family Medicine

## 2017-08-01 ENCOUNTER — Encounter (HOSPITAL_COMMUNITY): Payer: Self-pay

## 2017-08-01 DIAGNOSIS — J069 Acute upper respiratory infection, unspecified: Secondary | ICD-10-CM

## 2017-08-01 MED ORDER — AZITHROMYCIN 250 MG PO TABS: 250.0000 mg | ORAL_TABLET | Freq: Every day | ORAL | 0 refills | Status: AC

## 2017-08-01 NOTE — ED Triage Notes (Signed)
Pt presents today with cold sxs that started about a week ago. States that she has had a dry cough, little bit of a sore throat, some SOB, and feels like it is settling in her chest. Has tried OTC mucinex with not much relief. Has had pneumonia in the past and just wants to make sure that its not turning into it.

## 2017-08-03 NOTE — ED Provider Notes (Signed)
  [facility]   [id number] 08/01/17 Arrival Time: 1737  ASSESSMENT & PLAN:  1. Upper respiratory tract infection, unspecified type     Meds ordered this encounter  Medications  . azithromycin (ZITHROMAX) 250 MG tablet    Sig: Take 1 tablet (250 mg total) by mouth daily. Take first 2 tablets together, then 1 every day until finished.    Dispense:  6 tablet    Refill:  0   Based on duration and symptoms will treat. OTC symptom care as needed. She may f/u as needed.  Reviewed expectations re: course of current medical issues. Questions answered. Outlined signs and symptoms indicating need for more acute intervention. Patient verbalized understanding. After Visit Summary given.   SUBJECTIVE:  Sandy Gilbert is a 46 y.o. female who presents with complaint of nasal congestion, post-nasal drainage, and a persistent cough. Onset abrupt approximately 1 week ago. Not improving. Overall fatigued. SOB: none. Wheezing: none. OTC treatment: Mucinex without much relief. Reports h/o PNA in the past. Non-smoker.  ROS: As per HPI.   OBJECTIVE:  Vitals:   08/01/17 1829  BP: 122/62  Pulse: 87  Resp: 16  Temp: 98.1 F (36.7 C)  TempSrc: Oral  SpO2: 96%     General appearance: alert; no distress HEENT: nasal congestion; clear runny nose; throat irritation secondary to post-nasal drainage Neck: supple without LAD Lungs: clear to auscultation bilaterally Skin: warm and dry Psychological: alert and cooperative; normal mood and affect   Allergies  Allergen Reactions  . Codeine     Headache.     Past Medical History:  Diagnosis Date  . GERD (gastroesophageal reflux disease)    Social History   Social History  . Marital status: Married    Spouse name: N/A  . Number of children: N/A  . Years of education: N/A   Occupational History  . Not on file.   Social History Main Topics  . Smoking status: Former Smoker    Quit date: 03/04/2000  . Smokeless tobacco:  Never Used  . Alcohol use Yes     Comment: very rare  . Drug use: No  . Sexual activity: Not on file   Other Topics Concern  . Not on file   Social History Narrative  . No narrative on file            Mardella LaymanHagler, Aariel Ems, MD 08/03/17 [phone]

## 2017-09-08 ENCOUNTER — Ambulatory Visit: Payer: BLUE CROSS/BLUE SHIELD | Admitting: Physician Assistant

## 2018-02-02 ENCOUNTER — Other Ambulatory Visit: Payer: Self-pay | Admitting: Obstetrics and Gynecology

## 2018-02-02 DIAGNOSIS — N6310 Unspecified lump in the right breast, unspecified quadrant: Secondary | ICD-10-CM

## 2018-02-09 ENCOUNTER — Other Ambulatory Visit: Payer: BLUE CROSS/BLUE SHIELD

## 2018-02-17 ENCOUNTER — Ambulatory Visit
Admission: RE | Admit: 2018-02-17 | Discharge: 2018-02-17 | Disposition: A | Discharge status: Home / Self Care | Payer: BLUE CROSS/BLUE SHIELD | Source: Ambulatory Visit | Attending: Obstetrics and Gynecology | Admitting: Obstetrics and Gynecology

## 2018-02-17 DIAGNOSIS — N6310 Unspecified lump in the right breast, unspecified quadrant: Secondary | ICD-10-CM

## 2021-08-13 ENCOUNTER — Other Ambulatory Visit: Payer: Self-pay

## 2021-08-13 ENCOUNTER — Encounter (HOSPITAL_BASED_OUTPATIENT_CLINIC_OR_DEPARTMENT_OTHER): Payer: Self-pay | Admitting: Emergency Medicine

## 2021-08-13 ENCOUNTER — Emergency Department (HOSPITAL_BASED_OUTPATIENT_CLINIC_OR_DEPARTMENT_OTHER): Payer: 59

## 2021-08-13 ENCOUNTER — Emergency Department (HOSPITAL_BASED_OUTPATIENT_CLINIC_OR_DEPARTMENT_OTHER)
Admission: EM | Admit: 2021-08-13 | Discharge: 2021-08-13 | Disposition: A | Discharge status: Home / Self Care | Payer: 59 | Attending: Emergency Medicine | Admitting: Emergency Medicine

## 2021-08-13 DIAGNOSIS — R072 Precordial pain: Secondary | ICD-10-CM | POA: Diagnosis not present

## 2021-08-13 DIAGNOSIS — R079 Chest pain, unspecified: Secondary | ICD-10-CM

## 2021-08-13 DIAGNOSIS — Z79899 Other long term (current) drug therapy: Secondary | ICD-10-CM | POA: Insufficient documentation

## 2021-08-13 DIAGNOSIS — Z87891 Personal history of nicotine dependence: Secondary | ICD-10-CM | POA: Insufficient documentation

## 2021-08-13 DIAGNOSIS — Z20822 Contact with and (suspected) exposure to covid-19: Secondary | ICD-10-CM | POA: Insufficient documentation

## 2021-08-13 LAB — CBC WITH DIFFERENTIAL/PLATELET
Abs Immature Granulocytes: 0.01 10*3/uL (ref 0.00–0.07)
Basophils Absolute: 0 10*3/uL (ref 0.0–0.1)
Basophils Relative: 1 %
Eosinophils Absolute: 0.1 10*3/uL (ref 0.0–0.5)
Eosinophils Relative: 2 %
HCT: 37.1 % (ref 36.0–46.0)
Hemoglobin: 12 g/dL (ref 12.0–15.0)
Immature Granulocytes: 0 %
Lymphocytes Relative: 29 %
Lymphs Abs: 1.4 10*3/uL (ref 0.7–4.0)
MCH: 28.4 pg (ref 26.0–34.0)
MCHC: 32.3 g/dL (ref 30.0–36.0)
MCV: 87.7 fL (ref 80.0–100.0)
Monocytes Absolute: 0.3 10*3/uL (ref 0.1–1.0)
Monocytes Relative: 6 %
Neutro Abs: 3 10*3/uL (ref 1.7–7.7)
Neutrophils Relative %: 62 %
Platelets: 324 10*3/uL (ref 150–400)
RBC: 4.23 MIL/uL (ref 3.87–5.11)
RDW: 12.7 % (ref 11.5–15.5)
WBC: 4.8 10*3/uL (ref 4.0–10.5)
nRBC: 0 % (ref 0.0–0.2)

## 2021-08-13 LAB — COMPREHENSIVE METABOLIC PANEL
ALT: 12 U/L (ref 0–44)
AST: 12 U/L — ABNORMAL LOW (ref 15–41)
Albumin: 3.7 g/dL (ref 3.5–5.0)
Alkaline Phosphatase: 35 U/L — ABNORMAL LOW (ref 38–126)
Anion gap: 6 (ref 5–15)
BUN: 14 mg/dL (ref 6–20)
CO2: 24 mmol/L (ref 22–32)
Calcium: 8.4 mg/dL — ABNORMAL LOW (ref 8.9–10.3)
Chloride: 108 mmol/L (ref 98–111)
Creatinine, Ser: 0.86 mg/dL (ref 0.44–1.00)
GFR, Estimated: >60 mL/min (ref >60)
Glucose, Bld: 90 mg/dL (ref 70–99)
Potassium: 3.9 mmol/L (ref 3.5–5.1)
Sodium: 138 mmol/L (ref 135–145)
Total Bilirubin: 0.5 mg/dL (ref 0.3–1.2)
Total Protein: 6.7 g/dL (ref 6.5–8.1)

## 2021-08-13 LAB — PREGNANCY, URINE: Preg Test, Ur: NEGATIVE

## 2021-08-13 LAB — TROPONIN I (HIGH SENSITIVITY)
Troponin I (High Sensitivity): <2 ng/L (ref <18)
Troponin I (High Sensitivity): <2 ng/L (ref <18)

## 2021-08-13 LAB — LIPASE, BLOOD: Lipase: 42 U/L (ref 11–51)

## 2021-08-13 LAB — RESP PANEL BY RT-PCR (FLU A&B, COVID) ARPGX2
Influenza A by PCR: NEGATIVE
Influenza B by PCR: NEGATIVE
SARS Coronavirus 2 by RT PCR: NEGATIVE

## 2021-08-13 LAB — D-DIMER, QUANTITATIVE: D-Dimer, Quant: 1.1 ug/mL-FEU — ABNORMAL HIGH (ref 0.00–0.50)

## 2021-08-13 MED ORDER — PANTOPRAZOLE SODIUM 20 MG PO TBEC: 20.0000 mg | DELAYED_RELEASE_TABLET | Freq: Every day | ORAL | 0 refills | Status: AC

## 2021-08-13 MED ORDER — ALUM & MAG HYDROXIDE-SIMETH 200-200-20 MG/5ML PO SUSP
30.0000 mL | Freq: Once | ORAL | Status: AC
  Administered 2021-08-13: 30 mL via ORAL
  Filled 2021-08-13: qty 30

## 2021-08-13 MED ORDER — SUCRALFATE 1 G PO TABS: 1.0000 g | ORAL_TABLET | Freq: Three times a day (TID) | ORAL | 0 refills | Status: AC

## 2021-08-13 MED ORDER — IOHEXOL 350 MG/ML SOLN
100.0000 mL | Freq: Once | INTRAVENOUS | Status: AC | PRN
  Administered 2021-08-13: 75 mL via INTRAVENOUS

## 2021-08-13 MED ORDER — LIDOCAINE VISCOUS HCL 2 % MT SOLN
15.0000 mL | Freq: Once | OROMUCOSAL | Status: AC
  Administered 2021-08-13: 15 mL via ORAL
  Filled 2021-08-13: qty 15

## 2021-08-13 NOTE — ED Triage Notes (Signed)
Chest tightness x days , got worse yesterday , radiating to upper back. Denies shortness of breath . Denies cough

## 2021-08-13 NOTE — ED Provider Notes (Signed)
[facility] EMERGENCY DEPARTMENT Provider Note   CSN: [id number] Arrival date & time: 08/13/21  0820     History Chief Complaint  Patient presents with   Chest Pain    Sandy Gilbert is a 50 y.o. female.  The history is provided by the patient.  Chest Pain Pain location:  Substernal area Pain quality: aching   Pain radiates to:  Does not radiate Pain severity:  Mild Onset quality:  Gradual Duration:  1 day Timing:  Constant Progression:  Unchanged Chronicity:  New Context: eating   Worsened by:  Nothing Associated symptoms: no abdominal pain, no back pain, no cough, no fever, no palpitations, no shortness of breath and no vomiting   Risk factors: birth control   Risk factors: no coronary artery disease, no diabetes mellitus, no high cholesterol, no hypertension and no prior DVT/PE    HPI: A 50 year old patient presents for evaluation of chest pain. Initial onset of pain was more than 6 hours ago. The patient's chest pain is described as heaviness/pressure/tightness and is not worse with exertion. The patient's chest pain is not middle- or left-sided, is not well-localized, is not sharp and does not radiate to the arms/jaw/neck. The patient does not complain of nausea and denies diaphoresis. The patient has no history of stroke, has no history of peripheral artery disease, has not smoked in the past 90 days, denies any history of treated diabetes, has no relevant family history of coronary artery disease (first degree relative at less than age 4), is not hypertensive, has no history of hypercholesterolemia and does not have an elevated BMI (>=30).   Past Medical History:  Diagnosis Date   GERD (gastroesophageal reflux disease)     Patient Active Problem List   Diagnosis Date Noted   Obstruction of bile duct 05/22/2014   Nonspecific (abnormal) findings on radiological and other examination of biliary tract 05/22/2014   Pancreatitis 05/17/2014   Gallstones  05/17/2014    Past Surgical History:  Procedure Laterality Date   CHOLECYSTECTOMY N/A 05/22/2014   Procedure: LAPAROSCOPIC CHOLECYSTECTOMY WITH INTRAOPERATIVE CHOLANGIOGRAM;  Surgeon: Kandis Cocking, MD;  Location: [facility];  Service: General;  Laterality: N/A;   ERCP N/A 05/23/2014   Procedure: ENDOSCOPIC RETROGRADE CHOLANGIOPANCREATOGRAPHY (ERCP);  Surgeon: Rachael Fee, MD;  Location: [facility];  Service: Gastroenterology;  Laterality: N/A;     OB History   No obstetric history on file.     No family history on file.  Social History   Tobacco Use   Smoking status: Former    Types: Cigarettes    Quit date: 03/04/2000    Years since quitting: 21.4   Smokeless tobacco: Never  Substance Use Topics   Alcohol use: Yes    Comment: very rare   Drug use: No    Home Medications Prior to Admission medications   Medication Sig Start Date End Date Taking? Authorizing Provider  pantoprazole (PROTONIX) 20 MG tablet Take 1 tablet (20 mg total) by mouth daily. 08/13/21 09/12/21 Yes Zackery Brine, DO  sucralfate (CARAFATE) 1 g tablet Take 1 tablet (1 g total) by mouth 4 (four) times daily -  with meals and at bedtime for 14 days. 08/13/21 08/27/21 Yes Kayia Billinger, DO  azithromycin (ZITHROMAX) 250 MG tablet Take 1 tablet (250 mg total) by mouth daily. Take first 2 tablets together, then 1 every day until finished. 08/01/17   Mardella Layman, MD  etonogestrel-ethinyl estradiol (NUVARING) 0.12-0.015 MG/24HR vaginal ring Place 1 each vaginally every 28 (  twenty-eight) days. Insert vaginally and leave in place for 3 consecutive weeks, then remove for 1 week.    [provider]  ibuprofen (ADVIL,MOTRIN) 200 MG tablet Take 200 mg by mouth every 6 (six) hours as needed.    [provider]  Lactulose 20 GM/30ML SOLN Take 30 mLs (20 g total) by mouth 2 (two) times daily as needed (constipation). 05/19/15   Shawnee Knapp, MD  magnesium citrate solution Take 296 mLs by mouth once. Patient not  taking: Reported on 05/19/2015 05/14/15   Shawnee Knapp, MD  polyethylene glycol powder (GLYCOLAX/MIRALAX) powder Take 17 g by mouth 2 (two) times daily as needed. 05/14/15   Shawnee Knapp, MD    Allergies    Codeine  Review of Systems   Review of Systems  Constitutional:  Negative for chills and fever.  HENT:  Negative for ear pain and sore throat.   Eyes:  Negative for pain and visual disturbance.  Respiratory:  Negative for cough and shortness of breath.   Cardiovascular:  Positive for chest pain. Negative for palpitations.  Gastrointestinal:  Negative for abdominal pain and vomiting.  Genitourinary:  Negative for dysuria and hematuria.  Musculoskeletal:  Negative for arthralgias and back pain.  Skin:  Negative for color change and rash.  Neurological:  Negative for seizures and syncope.  All other systems reviewed and are negative.  Physical Exam Updated Vital Signs BP 104/69 (BP Location: Right Arm)   Pulse 61   Temp 98.5 F (36.9 C)   Resp 20   Ht 5\' 5"  (1.651 m)   Wt 72.6 kg   LMP 07/30/2021 (Approximate)   SpO2 100%   BMI 26.63 kg/m   Physical Exam Vitals and nursing note reviewed.  Constitutional:      General: She is not in acute distress.    Appearance: She is well-developed.  HENT:     Head: Normocephalic and atraumatic.  Eyes:     Extraocular Movements: Extraocular movements intact.     Conjunctiva/sclera: Conjunctivae normal.     Pupils: Pupils are equal, round, and reactive to light.  Cardiovascular:     Rate and Rhythm: Normal rate and regular rhythm.     Pulses:          Radial pulses are 2+ on the right side and 2+ on the left side.     Heart sounds: No murmur heard. Pulmonary:     Effort: Pulmonary effort is normal. No respiratory distress.     Breath sounds: Normal breath sounds.  Abdominal:     Palpations: Abdomen is soft.     Tenderness: There is no abdominal tenderness.  Musculoskeletal:        General: Normal range of motion.     Cervical back:  Normal range of motion and neck supple.  Skin:    General: Skin is warm and dry.     Capillary Refill: Capillary refill takes less than 2 seconds.  Neurological:     General: No focal deficit present.     Mental Status: She is alert.    ED Results / Procedures / Treatments   Labs (all labs ordered are listed, but only abnormal results are displayed) Labs Reviewed  COMPREHENSIVE METABOLIC PANEL - Abnormal; Notable for the following components:      Result Value   Calcium 8.4 (*)    AST 12 (*)    Alkaline Phosphatase 35 (*)    All other components within normal limits  D-DIMER, QUANTITATIVE -  Abnormal; Notable for the following components:   D-Dimer, Quant 1.10 (*)    All other components within normal limits  RESP PANEL BY RT-PCR (FLU A&B, COVID) ARPGX2  CBC WITH DIFFERENTIAL/PLATELET  LIPASE, BLOOD  PREGNANCY, URINE  TROPONIN I (HIGH SENSITIVITY)  TROPONIN I (HIGH SENSITIVITY)    EKG EKG Interpretation  Date/Time:  Tuesday August 13 2021 08:35:02 EDT Ventricular Rate:  73 PR Interval:  143 QRS Duration: 98 QT Interval:  378 QTC Calculation: 417 R Axis:   68 Text Interpretation: Sinus rhythm Ventricular premature complex Low voltage, precordial leads Confirmed by Lennice Sites ([id number]) on 08/13/2021 9:56:38 AM  Radiology CT Angio Chest PE W and/or Wo Contrast  Result Date: 08/13/2021 CLINICAL DATA:  50 year old female with chest tightness and elevated D-dimer. EXAM: CT ANGIOGRAPHY CHEST WITH CONTRAST TECHNIQUE: Multidetector CT imaging of the chest was performed using the standard protocol during bolus administration of intravenous contrast. Multiplanar CT image reconstructions and MIPs were obtained to evaluate the vascular anatomy. CONTRAST:  Seventy-five mL Omnipaque 350, intravenous COMPARISON:  12/14/2015 FINDINGS: Cardiovascular: Satisfactory opacification of the pulmonary arteries to the segmental level. No evidence of pulmonary embolism. Normal heart size. No  pericardial effusion. Mediastinum/Nodes: No enlarged mediastinal, hilar, or axillary lymph nodes. Thyroid gland, trachea, and esophagus demonstrate no significant findings. Lungs/Pleura: No focal consolidations. No suspicious pulmonary nodules. No pleural effusion or pneumothorax. Upper Abdomen: Incompletely visualized, similar appearing exophytic simple renal cyst arising from the upper pole of the left kidney. The gallbladder is surgically absent. No acute abnormality in the upper abdomen. Musculoskeletal: No chest wall abnormality. No acute or significant osseous findings. Review of the MIP images confirms the above findings. IMPRESSION: Vascular: No evidence of pulmonary embolism. Non-Vascular: No acute intrathoracic abnormality. Ruthann Cancer, MD [facility] Radiology Electronically Signed   By: Ruthann Cancer M.D.   On: 08/13/2021 11:15   DG Chest Portable 1 View  Result Date: 08/13/2021 CLINICAL DATA:  Chest pain EXAM: PORTABLE CHEST 1 VIEW COMPARISON:  Radiograph 12/13/2015 FINDINGS: Cardiomediastinal silhouette is unchanged. There is no focal airspace consolidation. There is no large pleural effusion or visible pneumothorax. There is no acute osseous abnormality. IMPRESSION: No evidence of acute cardiopulmonary disease. Electronically Signed   By: Maurine Simmering M.D.   On: 08/13/2021 08:47    Procedures Procedures   Medications Ordered in ED Medications  alum & mag hydroxide-simeth (MAALOX/MYLANTA) 200-200-20 MG/5ML suspension 30 mL (30 mLs Oral Given 08/13/21 0921)    And  lidocaine (XYLOCAINE) 2 % viscous mouth solution 15 mL (15 mLs Oral Given 08/13/21 0921)  iohexol (OMNIPAQUE) 350 MG/ML injection 100 mL (75 mLs Intravenous Contrast Given 08/13/21 1031)    ED Course  I have reviewed the triage vital signs and the nursing notes.  Pertinent labs & imaging results that were available during my care of the patient were reviewed by me and  considered in my medical decision making (see chart for details).    MDM Rules/Calculators/A&P HEAR Score: 1                         TAIYAH GINTHER is here with chest pain.  Normal vitals.  No fever.  EKG shows sinus rhythm.  No cardiac risk factors but will check troponin.  Does take estrogen and will check D-dimer to rule out PE.  Chest x-ray negative already for infection and pneumothorax.  Does not sound like it is pancreatitis or cholecystitis.  Gallbladder  has been removed in the past.  However will check liver enzymes and pancreas enzymes.  Will check COVID test.  Will give GI cocktail.  Sounds like this could be reflux.  Heart score is 1.  Troponin negative x2.  D-dimer was elevated but PE scan showed no evidence of blood clot.  No significant anemia, electrolyte ab Mody, kidney injury otherwise.  Felt little bit better after GI cocktail.  Suspect symptoms more likely related to GI issues.  We will start Protonix and Carafate.  We will have her follow-up with her primary care doctor.  Discharged in good condition.  This chart was dictated using voice recognition software.  Despite best efforts to proofread,  errors can occur which can change the documentation meaning.   Final Clinical Impression(s) / ED Diagnoses Final diagnoses:  Nonspecific chest pain    Rx / DC Orders ED Discharge Orders          Ordered    pantoprazole (PROTONIX) 20 MG tablet  Daily        08/13/21 1127    sucralfate (CARAFATE) 1 g tablet  3 times daily with meals & bedtime        08/13/21 [place], [place], DO 08/13/21 1128
